# Patient Record
Sex: Male | Born: 1950 | Race: White | Hispanic: No | Marital: Married | Smoking: Former smoker
Health system: Southern US, Community
[De-identification: ages and names within clinical notes are randomized; demographics above are authoritative.]

## PROBLEM LIST (undated history)

## (undated) DIAGNOSIS — K921 Melena: Secondary | ICD-10-CM

## (undated) DIAGNOSIS — Z9889 Other specified postprocedural states: Secondary | ICD-10-CM

## (undated) DIAGNOSIS — Z860100 Personal history of colon polyps, unspecified: Secondary | ICD-10-CM

## (undated) DIAGNOSIS — K5190 Ulcerative colitis, unspecified, without complications: Secondary | ICD-10-CM

## (undated) HISTORY — DX: Melena: K92.1

## (undated) HISTORY — DX: Personal history of colon polyps, unspecified: Z86.0100

## (undated) HISTORY — PX: KNEE SURGERY: SHX244

## (undated) HISTORY — DX: Personal history of colonic polyps: Z86.010

## (undated) HISTORY — DX: Ulcerative colitis, unspecified, without complications: K51.90

## (undated) HISTORY — PX: BACK SURGERY: SHX140

---

## 2016-07-22 DIAGNOSIS — S39012A Strain of muscle, fascia and tendon of lower back, initial encounter: Secondary | ICD-10-CM | POA: Insufficient documentation

## 2016-07-22 DIAGNOSIS — M545 Low back pain, unspecified: Secondary | ICD-10-CM | POA: Insufficient documentation

## 2017-03-04 ENCOUNTER — Encounter: Payer: Self-pay | Admitting: *Deleted

## 2017-03-29 ENCOUNTER — Encounter: Payer: Self-pay | Admitting: Family Medicine

## 2017-03-29 ENCOUNTER — Ambulatory Visit (INDEPENDENT_AMBULATORY_CARE_PROVIDER_SITE_OTHER): Payer: BLUE CROSS/BLUE SHIELD | Admitting: Family Medicine

## 2017-03-29 VITALS — BP 108/62 | HR 60 | Temp 98.3°F | Ht 66.0 in | Wt 169.8 lb

## 2017-03-29 DIAGNOSIS — K5150 Left sided colitis without complications: Secondary | ICD-10-CM | POA: Insufficient documentation

## 2017-03-29 DIAGNOSIS — K5190 Ulcerative colitis, unspecified, without complications: Secondary | ICD-10-CM | POA: Diagnosis not present

## 2017-03-29 DIAGNOSIS — F5101 Primary insomnia: Secondary | ICD-10-CM

## 2017-03-29 DIAGNOSIS — Z7689 Persons encountering health services in other specified circumstances: Secondary | ICD-10-CM | POA: Diagnosis not present

## 2017-03-29 DIAGNOSIS — R39198 Other difficulties with micturition: Secondary | ICD-10-CM

## 2017-03-29 DIAGNOSIS — N529 Male erectile dysfunction, unspecified: Secondary | ICD-10-CM

## 2017-03-29 LAB — POC URINALSYSI DIPSTICK (AUTOMATED)
BILIRUBIN UA: NEGATIVE
Blood, UA: NEGATIVE
GLUCOSE UA: NEGATIVE
Ketones, UA: NEGATIVE
LEUKOCYTES UA: NEGATIVE
NITRITE UA: NEGATIVE
PH UA: 6 (ref 5.0–8.0)
Protein, UA: NEGATIVE
Spec Grav, UA: >=1.03 — AB (ref 1.010–1.025)
Urobilinogen, UA: 0.2 E.U./dL

## 2017-03-29 LAB — CBC WITH DIFFERENTIAL/PLATELET
BASOS ABS: 0.1 10*3/uL (ref 0.0–0.1)
Basophils Relative: 1.2 % (ref 0.0–3.0)
EOS ABS: 0.7 10*3/uL (ref 0.0–0.7)
Eosinophils Relative: 11.2 % — ABNORMAL HIGH (ref 0.0–5.0)
HEMATOCRIT: 40.4 % (ref 39.0–52.0)
HEMOGLOBIN: 13.4 g/dL (ref 13.0–17.0)
LYMPHS PCT: 32.3 % (ref 12.0–46.0)
Lymphs Abs: 2.2 10*3/uL (ref 0.7–4.0)
MCHC: 33 g/dL (ref 30.0–36.0)
MCV: 81.1 fl (ref 78.0–100.0)
Monocytes Absolute: 0.7 10*3/uL (ref 0.1–1.0)
Monocytes Relative: 10.2 % (ref 3.0–12.0)
Neutro Abs: 3 10*3/uL (ref 1.4–7.7)
Neutrophils Relative %: 45.1 % (ref 43.0–77.0)
Platelets: 278 10*3/uL (ref 150.0–400.0)
RBC: 4.98 Mil/uL (ref 4.22–5.81)
RDW: 18.7 % — AB (ref 11.5–15.5)
WBC: 6.7 10*3/uL (ref 4.0–10.5)

## 2017-03-29 LAB — PSA: PSA: 0.41 ng/mL (ref 0.10–4.00)

## 2017-03-29 LAB — TSH: TSH: 2.02 u[IU]/mL (ref 0.35–4.50)

## 2017-03-29 NOTE — Progress Notes (Signed)
Subjective:    Patient ID: Gregory Obrien, male    DOB: 23-Oct-1950, 67 y.o.   MRN: [id number]  HPI This is a 67 yo male who presents today to establish care. 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 Enjoys basketball, his dogs.   Last CPE- many years ago PSA- never, requests today Colonoscopy- every year due to UC Tdap- unsure Flu- annual Dental- regular Eye- regular Exercise- walks his dog for short walks several times a day and works out at gym 1x/week   UC- Has a history of ulcerative colitis. Remission x 2.5 years. Sees Dr. Beatrix Fetters out of [place].   GU- Some weak urine stream x about 1 year. He requests DRE and psa today.  Good libido, occasional difficulty obtaining an erection, no difficulty with climax. No dysuria, no frequency, no discharge.   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 Rarely requires any medication for pain. Thinks back pain may interfere with sexual performance.   Insomnia- goes to bed around 9-10, awakens around 3-4 and can't go back to sleep. Gets up around 5 am. Every night sleep is disrupted. Around 4-5 hours sleep a night. Is able to function for work, but has daytime sleepiness. Occasionally naps. Takes diphenhydramine occasionally, melatonin slight improvement. Stress level not high. Works out 1x week and walks some daily.  AM coffee, no alcohol.   Denies- headache, chest pain, SOB, nausea, vomiting, diarrhea, constipation, abdominal pain, leg swelling.   Past Medical History:  Diagnosis Date  . Blood in stool   . History of colon polyps    Past Surgical History:  Procedure Laterality Date  . BACK SURGERY    . KNEE SURGERY     Family History  Problem Relation Age of Onset  . Heart disease Father   . Heart attack Father   . Drug abuse Brother   . Early death Brother    Social History   Tobacco Use  . Smoking status: Former  Games developer  . Smokeless tobacco: Never Used  Substance Use Topics  . Alcohol use: Yes  . Drug use: Never       Review of Systems Per HPI    Objective:   Physical Exam  Constitutional: He is oriented to person, place, and time. He appears well-developed and well-nourished. No distress.  HENT:  Head: Normocephalic and atraumatic.  Eyes: Conjunctivae are normal.  Cardiovascular: Normal rate, regular rhythm and normal heart sounds.  Pulmonary/Chest: Effort normal and breath sounds normal.  Genitourinary: Rectal exam shows no external hemorrhoid, no mass and no tenderness.  Genitourinary Comments: Difficult to palpate prostate. No masses. Non tender.   Musculoskeletal: He exhibits no edema.  Neurological: He is alert and oriented to person, place, and time.  Skin: Skin is warm and dry. He is not diaphoretic.  Psychiatric: He has a normal mood and affect. His behavior is normal. Judgment and thought content normal.  Vitals reviewed.    BP 108/62   Pulse 60   Temp 98.3 F (36.8 C) (Oral)   Ht 5\' 6"  (1.676 m)   Wt 169 lb 12 oz (77 kg)   SpO2 96%   BMI 27.40 kg/m       Assessment & Plan:  1. Encounter to establish care - Discussed and encouraged healthy lifestyle choices- adequate sleep, regular exercise, stress management and healthy food choices.  - follow up for CPE 6  months - will obtain records from PCP  2. Decreased urine stream - PSA - POCT Urinalysis Dipstick (Automated)  3. Erectile dysfunction, unspecified erectile dysfunction type - TSH - CBC with Differential - Testosterone, future- will check early morning with CMET, lipids  4. Ulcerative colitis without complications, unspecified location [facility](HCC) - currently in remission, will obtain records from GI  5. Primary insomnia - Discussed sleep hygiene, recommended he increase exercise, avoid electronics an hour before bedtime    Olean Reeeborah Gessner, FNP-BC  [facility], [facility]  03/30/2017 8:09 AM

## 2017-03-29 NOTE — Patient Instructions (Signed)
Good to meet you today  Please schedule your complete physical for September  I will notify you of lab results  Insomnia Insomnia is a sleep disorder that makes it difficult to fall asleep or to stay asleep. Insomnia can cause tiredness (fatigue), low energy, difficulty concentrating, mood swings, and poor performance at work or school. There are three different ways to classify insomnia:  Difficulty falling asleep.  Difficulty staying asleep.  Waking up too early in the morning.  Any type of insomnia can be long-term (chronic) or short-term (acute). Both are common. Short-term insomnia usually lasts for three months or less. Chronic insomnia occurs at least three times a week for longer than three months. What are the causes? Insomnia may be caused by another condition, situation, or substance, such as:  Anxiety.  Certain medicines.  Gastroesophageal reflux disease (GERD) or other gastrointestinal conditions.  Asthma or other breathing conditions.  Restless legs syndrome, sleep apnea, or other sleep disorders.  Chronic pain.  Menopause. This may include hot flashes.  Stroke.  Abuse of alcohol, tobacco, or illegal drugs.  Depression.  Caffeine.  Neurological disorders, such as Alzheimer disease.  An overactive thyroid (hyperthyroidism).  The cause of insomnia may not be known. What increases the risk? Risk factors for insomnia include:  Gender. Women are more commonly affected than men.  Age. Insomnia is more common as you get older.  Stress. This may involve your professional or personal life.  Income. Insomnia is more common in people with lower income.  Lack of exercise.  Irregular work schedule or night shifts.  Traveling between different time zones.  What are the signs or symptoms? If you have insomnia, trouble falling asleep or trouble staying asleep is the main symptom. This may lead to other symptoms, such as:  Feeling fatigued.  Feeling  nervous about going to sleep.  Not feeling rested in the morning.  Having trouble concentrating.  Feeling irritable, anxious, or depressed.  How is this treated? Treatment for insomnia depends on the cause. If your insomnia is caused by an underlying condition, treatment will focus on addressing the condition. Treatment may also include:  Medicines to help you sleep.  Counseling or therapy.  Lifestyle adjustments.  Follow these instructions at home:  Take medicines only as directed by your health care provider.  Keep regular sleeping and waking hours. Avoid naps.  Keep a sleep diary to help you and your health care provider figure out what could be causing your insomnia. Include: ? When you sleep. ? When you wake up during the night. ? How well you sleep. ? How rested you feel the next day. ? Any side effects of medicines you are taking. ? What you eat and drink.  Make your bedroom a comfortable place where it is easy to fall asleep: ? Put up shades or special blackout curtains to block light from outside. ? Use a white noise machine to block noise. ? Keep the temperature cool.  Exercise regularly as directed by your health care provider. Avoid exercising right before bedtime.  Use relaxation techniques to manage stress. Ask your health care provider to suggest some techniques that may work well for you. These may include: ? Breathing exercises. ? Routines to release muscle tension. ? Visualizing peaceful scenes.  Cut back on alcohol, caffeinated beverages, and cigarettes, especially close to bedtime. These can disrupt your sleep.  Do not overeat or eat spicy foods right before bedtime. This can lead to digestive discomfort that can make it  hard for you to sleep.  Limit screen use before bedtime. This includes: ? Watching TV. ? Using your smartphone, tablet, and computer.  Stick to a routine. This can help you fall asleep faster. Try to do a quiet activity, brush  your teeth, and go to bed at the same time each night.  Get out of bed if you are still awake after 15 minutes of trying to sleep. Keep the lights down, but try reading or doing a quiet activity. When you feel sleepy, go back to bed.  Make sure that you drive carefully. Avoid driving if you feel very sleepy.  Keep all follow-up appointments as directed by your health care provider. This is important. Contact a health care provider if:  You are tired throughout the day or have trouble in your daily routine due to sleepiness.  You continue to have sleep problems or your sleep problems get worse. Get help right away if:  You have serious thoughts about hurting yourself or someone else. This information is not intended to replace advice given to you by your health care provider. Make sure you discuss any questions you have with your health care provider. Document Released: 12/20/1999 Document Revised: 05/24/2015 Document Reviewed: 09/22/2013 Elsevier Interactive Patient Education  [employer].

## 2017-03-31 NOTE — Addendum Note (Signed)
Addended by: Olean ReeGESSNER, DEBORAH B on: 03/31/2017 06:09 PM   Modules accepted: Orders

## 2017-04-05 ENCOUNTER — Other Ambulatory Visit (INDEPENDENT_AMBULATORY_CARE_PROVIDER_SITE_OTHER): Payer: BLUE CROSS/BLUE SHIELD

## 2017-04-05 DIAGNOSIS — N529 Male erectile dysfunction, unspecified: Secondary | ICD-10-CM | POA: Diagnosis not present

## 2017-04-05 DIAGNOSIS — R7989 Other specified abnormal findings of blood chemistry: Secondary | ICD-10-CM

## 2017-04-05 LAB — LIPID PANEL
Cholesterol: 251 mg/dL — ABNORMAL HIGH (ref 0–200)
HDL: 43.6 mg/dL (ref >39.00)
LDL CALC: 186 mg/dL — AB (ref 0–99)
NonHDL: 206.97
TRIGLYCERIDES: 104 mg/dL (ref 0.0–149.0)
Total CHOL/HDL Ratio: 6
VLDL: 20.8 mg/dL (ref 0.0–40.0)

## 2017-04-05 LAB — COMPREHENSIVE METABOLIC PANEL
ALBUMIN: 4.1 g/dL (ref 3.5–5.2)
ALK PHOS: 45 U/L (ref 39–117)
ALT: 11 U/L (ref 0–53)
AST: 15 U/L (ref 0–37)
BUN: 21 mg/dL (ref 6–23)
CO2: 27 mEq/L (ref 19–32)
Calcium: 9.5 mg/dL (ref 8.4–10.5)
Chloride: 104 mEq/L (ref 96–112)
Creatinine, Ser: 1.15 mg/dL (ref 0.40–1.50)
GFR: 67.75 mL/min (ref >60.00)
Glucose, Bld: 120 mg/dL — ABNORMAL HIGH (ref 70–99)
POTASSIUM: 4 meq/L (ref 3.5–5.1)
Sodium: 140 mEq/L (ref 135–145)
Total Bilirubin: 0.4 mg/dL (ref 0.2–1.2)
Total Protein: 7.5 g/dL (ref 6.0–8.3)

## 2017-04-05 LAB — TESTOSTERONE: Testosterone: 154.76 ng/dL — ABNORMAL LOW (ref 300.00–890.00)

## 2017-04-06 NOTE — Addendum Note (Signed)
Addended by: Olean ReeGESSNER, Lamekia Nolden B on: 04/06/2017 11:26 AM   Modules accepted: Orders

## 2017-04-07 ENCOUNTER — Other Ambulatory Visit: Payer: Self-pay | Admitting: Family Medicine

## 2017-04-07 DIAGNOSIS — E782 Mixed hyperlipidemia: Secondary | ICD-10-CM

## 2017-04-07 MED ORDER — PRAVASTATIN SODIUM 20 MG PO TABS: 20.0000 mg | ORAL_TABLET | Freq: Every day | ORAL | 3 refills | Status: DC

## 2017-05-05 ENCOUNTER — Ambulatory Visit (INDEPENDENT_AMBULATORY_CARE_PROVIDER_SITE_OTHER): Payer: BLUE CROSS/BLUE SHIELD | Admitting: Urology

## 2017-05-05 ENCOUNTER — Encounter: Payer: Self-pay | Admitting: Urology

## 2017-05-05 VITALS — BP 121/82 | HR 83 | Resp 16 | Ht 66.0 in | Wt 171.8 lb

## 2017-05-05 DIAGNOSIS — E291 Testicular hypofunction: Secondary | ICD-10-CM

## 2017-05-05 DIAGNOSIS — N5201 Erectile dysfunction due to arterial insufficiency: Secondary | ICD-10-CM | POA: Diagnosis not present

## 2017-05-05 MED ORDER — SILDENAFIL CITRATE 20 MG PO TABS: ORAL_TABLET | ORAL | 0 refills | Status: DC

## 2017-05-05 NOTE — Progress Notes (Signed)
05/05/2017 6:22 PM   Gregory Obrien [birth year] [id number]  Referring provider: Emi Belfast, FNP [address] [place], [place] [address]  Chief Complaint  Patient presents with  . Erectile Dysfunction    HPI: 67 year old male seen in consultation at the request of Earl Many, FNP for evaluation of hypogonadism and erectile dysfunction.  He was seen on 03/29/2017 for a routine physical and had complaints of a weak urinary stream for approximately 1 year and difficulty achieving and maintaining an erection.  He states his stream is slightly decreased but not bothersome.  He was concerned of this symptom.  He did have a PSA drawn which was low at 0.41.  He has mild intermittent urinary stream and urinary hesitancy.  He has no frequency or urgency.  He also notes occasional difficulty achieving and maintaining an erection.  He is able to achieve penetration and have successful intercourse approximately 65% of the time.  He denies pain with erections or curvature with erections.  His libido is good although he does relate to some tiredness and fatigue.  He was recently diagnosed with hyper cholesterol and started on pravastatin.  He smoked for 15 years and quit approximately 15 years ago.  A testosterone level was low at 155.  He denies previous history of urologic problems or prior urologic evaluation.   PMH: Past Medical History:  Diagnosis Date  . Blood in stool   . History of colon polyps   . UC (ulcerative colitis) (HCC)   . Ulcerative colitis (HCC)    remission since 2017, see Dr. Beatrix Fetters    Surgical History: Past Surgical History:  Procedure Laterality Date  . BACK SURGERY    . KNEE SURGERY      Home Medications:  Allergies as of 05/05/2017      Reactions   Infliximab Hives   (REMICADE)      Medication List        Accurate as of 05/05/17  6:22 PM. Always use your most recent med list.          pravastatin 20 MG tablet Commonly known as:   PRAVACHOL Take 1 tablet (20 mg total) by mouth daily.   sildenafil 20 MG tablet Commonly known as:  REVATIO 2-5 tabs 1 hour prior to intercourse       Allergies:  Allergies  Allergen Reactions  . Infliximab Hives    (REMICADE)    Family History: Family History  Problem Relation Age of Onset  . Heart disease Father   . Heart attack Father   . Drug abuse Brother   . Early death Brother     Social History:  reports that he has quit smoking. He has never used smokeless tobacco. He reports that he drinks alcohol. He reports that he does not use drugs.  ROS: UROLOGY Frequent Urination?: No Hard to postpone urination?: No Burning/pain with urination?: No Get up at night to urinate?: Yes Leakage of urine?: No Urine stream starts and stops?: Yes Do you have to strain to urinate?: Yes Blood in urine?: No Urinary tract infection?: No Sexually transmitted disease?: No Injury to kidneys or bladder?: No Painful intercourse?: No Weak stream?: Yes Erection problems?: Yes Penile pain?: No  Gastrointestinal Nausea?: No Vomiting?: No Indigestion/heartburn?: No Diarrhea?: No Constipation?: No  Constitutional Fever: No Night sweats?: No Weight loss?: No Fatigue?: No  Skin Skin rash/lesions?: No Itching?: No  Eyes Blurred vision?: No Double vision?: No  Ears/Nose/Throat Sore throat?: No Sinus problems?: No  Hematologic/Lymphatic Swollen glands?: No Easy bruising?: No  Cardiovascular Leg swelling?: No Chest pain?: No  Respiratory Cough?: No Shortness of breath?: No  Endocrine Excessive thirst?: No  Musculoskeletal Back pain?: Yes Joint pain?: No  Neurological Headaches?: No Dizziness?: No  Psychologic Depression?: No Anxiety?: No  Physical Exam: BP 121/82   Pulse 83   Resp 16   Ht  (1.676 m)   Wt 171 lb 12.8 oz (77.9 kg)   SpO2 98%   BMI 27.73 kg/m   Constitutional:  Alert and oriented, No acute distress. HEENT: [place] AT, moist  mucus membranes.  Trachea midline, no masses. Cardiovascular: No clubbing, cyanosis, or edema. Respiratory: Normal respiratory effort, no increased work of breathing. GI: Abdomen is soft, nontender, nondistended, no abdominal masses GU: No CVA tenderness.  Penis without lesions or corporal plaques.  Testes descended bilateral without masses or tenderness.  Prostate 30 g, smooth without nodules. Lymph: No cervical or inguinal lymphadenopathy. Skin: No rashes, bruises or suspicious lesions. Neurologic: Grossly intact, no focal deficits, moving all 4 extremities. Psychiatric: Normal mood and affect.  Laboratory Data: Lab Results  Component Value Date   WBC 6.7 03/29/2017   HGB 13.4 03/29/2017   HCT 40.4 03/29/2017   MCV 81.1 03/29/2017   PLT 278.0 03/29/2017    Lab Results  Component Value Date   CREATININE 1.15 04/05/2017    Lab Results  Component Value Date   PSA 0.41 03/29/2017    Lab Results  Component Value Date   TESTOSTERONE 154.76 (L) 04/05/2017     Assessment & Plan:   67 year old male with mild obstructive voiding symptoms, erectile dysfunction and hypogonadism.  DRE is benign and PSA is low.  I discussed medical management of his symptoms which are most likely due to outlet obstruction however he states they are not bothersome that he desires medication and he would like to monitor.  He has good libido and only mild ED.  His tiredness may not be related to his hypogonadism.  Testosterone replacement therapy and side effects were discussed.  He wanted to hold off on TRT but was interested in a trial of PDE 5 inhibitor.  An Rx for generic sildenafil was sent to his pharmacy.  Return if symptoms worsen or fail to improve.    Riki Altes, MD  [facility] [address] [place], [place] [address] [phone]

## 2017-05-13 ENCOUNTER — Encounter: Payer: Self-pay | Admitting: Urology

## 2017-05-19 ENCOUNTER — Ambulatory Visit: Payer: BLUE CROSS/BLUE SHIELD | Admitting: Family Medicine

## 2017-05-19 ENCOUNTER — Encounter: Payer: Self-pay | Admitting: Family Medicine

## 2017-05-19 VITALS — BP 138/80 | HR 74 | Temp 98.3°F | Ht 66.0 in | Wt 175.5 lb

## 2017-05-19 DIAGNOSIS — B9789 Other viral agents as the cause of diseases classified elsewhere: Secondary | ICD-10-CM

## 2017-05-19 DIAGNOSIS — J069 Acute upper respiratory infection, unspecified: Secondary | ICD-10-CM

## 2017-05-19 MED ORDER — BENZONATATE 100 MG PO CAPS: 100.0000 mg | ORAL_CAPSULE | Freq: Three times a day (TID) | ORAL | 0 refills | Status: DC | PRN

## 2017-05-19 MED ORDER — AMOXICILLIN 875 MG PO TABS: 875.0000 mg | ORAL_TABLET | Freq: Two times a day (BID) | ORAL | 0 refills | Status: DC

## 2017-05-19 MED ORDER — GUAIFENESIN-CODEINE 100-10 MG/5ML PO SYRP: 10.0000 mL | ORAL_SOLUTION | Freq: Three times a day (TID) | ORAL | 0 refills | Status: DC | PRN

## 2017-05-19 NOTE — Progress Notes (Signed)
Subjective:    Patient ID: Gregory Obrien, male    DOB: 1950-05-21, 67 y.o.   MRN: [id number]  HPI This is a 67 yo male who presents today with cough x 5 days. Has been hearing wheezing, little sputum, some SOB, subjective fever, exhausted. Runny nose started yesterday, clear. Some sore throat, no ear pain. Drinking herbal tea, using Vics vapor rub, alka selter cough. Ibuprofen for back pain 400 mg every 8 hours. Cough at night.  Sick contacts at work.   Past Medical History:  Diagnosis Date  . Blood in stool   . History of colon polyps   . UC (ulcerative colitis) (HCC)   . Ulcerative colitis (HCC)    remission since 2017, see Dr. Beatrix Fetters   Past Surgical History:  Procedure Laterality Date  . BACK SURGERY    . KNEE SURGERY     Family History  Problem Relation Age of Onset  . Heart disease Father   . Heart attack Father   . Drug abuse Brother   . Early death Brother    Social History   Tobacco Use  . Smoking status: Former Games developer  . Smokeless tobacco: Never Used  Substance Use Topics  . Alcohol use: Yes  . Drug use: Never      Review of Systems    per HPI Objective:   Physical Exam  Constitutional: He is oriented to person, place, and time. He appears well-developed and well-nourished. No distress.  HENT:  Head: Normocephalic and atraumatic.  Right Ear: Tympanic membrane, external ear and ear canal normal.  Left Ear: Tympanic membrane, external ear and ear canal normal.  Nose: Rhinorrhea present.  Mouth/Throat: Uvula is midline and mucous membranes are normal. Posterior oropharyngeal erythema present. No oropharyngeal exudate, posterior oropharyngeal edema or tonsillar abscesses.  Eyes: Conjunctivae are normal.  Neck: Normal range of motion. Neck supple.  Cardiovascular: Normal rate, regular rhythm and normal heart sounds.  Pulmonary/Chest: Effort normal and breath sounds normal.  Lymphadenopathy:    He has no cervical adenopathy.  Neurological: He is  alert and oriented to person, place, and time.  Skin: Skin is warm and dry. He is not diaphoretic.  Psychiatric: He has a normal mood and affect. His behavior is normal. Judgment and thought content normal.  Vitals reviewed.     BP 138/80 (BP Location: Right Arm, Patient Position: Sitting, Cuff Size: Normal)   Pulse 74   Temp 98.3 F (36.8 C) (Oral)   Ht  (1.676 m)   Wt 175 lb 8 oz (79.6 kg)   SpO2 96%   BMI 28.33 kg/m  Wt Readings from Last 3 Encounters:  05/19/17 175 lb 8 oz (79.6 kg)  05/05/17 171 lb 12.8 oz (77.9 kg)  03/29/17 169 lb 12 oz (77 kg)       Assessment & Plan:  1. Viral URI with cough -Provided written and verbal information regarding diagnosis and treatment. - Provided with wait and see antibiotic prescription and instructions for use - RTC precautions reviewed - add Mucinex, push fluids, rest, oow note for today and tomorrow - amoxicillin (AMOXIL) 875 MG tablet; Take 1 tablet (875 mg total) by mouth 2 (two) times daily.  Dispense: 14 tablet; Refill: 0 - benzonatate (TESSALON) 100 MG capsule; Take 1-2 capsules (100-200 mg total) by mouth 3 (three) times daily as needed for cough.  Dispense: 40 capsule; Refill: 0 - guaiFENesin-codeine (ROBITUSSIN AC) 100-10 MG/5ML syrup; Take 10 mLs by mouth 3 (three) times daily as needed  for cough.  Dispense: 120 mL; Refill: 0   Olean Ree, FNP-BC  [place] [facility]  05/19/2017 10:25 AM

## 2017-05-19 NOTE — Patient Instructions (Signed)
Good to see you today  I have sent in two medications for cough to your pharmacy- a pill for daytime and a liquid that may make you sleepy- don't take if you will be driving  Please add mucinex to thin your sputum. Generic is fine. Drink enough fluids to make your urine light yellow.   If not better in 5-7 days or if better then worse, please start antibiotic

## 2017-06-04 ENCOUNTER — Other Ambulatory Visit: Payer: Self-pay

## 2017-06-04 MED ORDER — SILDENAFIL CITRATE 20 MG PO TABS: ORAL_TABLET | ORAL | 10 refills | Status: DC

## 2017-06-08 ENCOUNTER — Telehealth: Payer: Self-pay | Admitting: Urology

## 2017-06-08 MED ORDER — SILDENAFIL CITRATE 20 MG PO TABS: ORAL_TABLET | ORAL | 2 refills | Status: DC

## 2017-06-08 NOTE — Telephone Encounter (Signed)
RX printed, patient notified to pick up RX.

## 2017-06-08 NOTE — Telephone Encounter (Signed)
Pt called office, was told at visit to try medication and if he tolerated well to call office back to get Rx sent in. Pt asks for Rx to be sent to [facility] [address] Please advise pt. Thanks.

## 2017-06-09 ENCOUNTER — Other Ambulatory Visit: Payer: Self-pay | Admitting: Urology

## 2017-06-09 ENCOUNTER — Telehealth: Payer: Self-pay

## 2017-06-09 DIAGNOSIS — N5201 Erectile dysfunction due to arterial insufficiency: Secondary | ICD-10-CM

## 2017-06-09 MED ORDER — SILDENAFIL CITRATE 20 MG PO TABS: ORAL_TABLET | ORAL | 1 refills | Status: DC

## 2017-06-09 NOTE — Telephone Encounter (Signed)
Incoming call from pt stating that [facility] [place], this is no longer the patient's current pharmacy rx needs [facility]. [facility] [place], had rx cancelled. RX sent to correct pharmacy.

## 2017-07-05 ENCOUNTER — Ambulatory Visit: Payer: Self-pay | Admitting: *Deleted

## 2017-07-05 NOTE — Telephone Encounter (Signed)
Patient states he had 2 dizziness episodes. He had to lay down yesterday- 15-20 minutes. Patient has been taking new medication for  2 months.Patient has not been monitoring BP at home- but he can monitor it at work. He is working out of town this week and he can not come in until late. Appointment given- patient to monitor his BP for fluctuations and if his symptoms come back- he is to be sem immediately.  Reason for Disposition . [1] MODERATE dizziness (e.g., interferes with normal activities) AND [2] has NOT been evaluated by physician for this  (Exception: dizziness caused by heat exposure, sudden standing, or poor fluid intake)  Answer Assessment - Initial Assessment Questions 1. DESCRIPTION: "Describe your dizziness."     Nauseous and felt like a weight was on him 2. LIGHTHEADED: "Do you feel lightheaded?" (e.g., somewhat faint, woozy, weak upon standing)     Spinning 3. VERTIGO: "Do you feel like either you or the room is spinning or tilting?" (i.e. vertigo)     Spinning and tilting 4. SEVERITY: "How bad is it?"  "Do you feel like you are going to faint?" "Can you stand and walk?"   - MILD - walking normally   - MODERATE - interferes with normal activities (e.g., work, school)    - SEVERE - unable to stand, requires support to walk, feels like passing out now.      Happened twice- severe yesterday 5. ONSET:  "When did the dizziness begin?"     2 weeks ago episode and yesterday 6. AGGRAVATING FACTORS: "Does anything make it worse?" (e.g., standing, change in head position)     Standing and walking- after 20 minutes began to pass 7. HEART RATE: "Can you tell me your heart rate?" "How many beats in 15 seconds?"  (Note: not all patients can do this)       Could not tell difference 8. CAUSE: "What do you think is causing the dizziness?"     Patient is on new medication 9. RECURRENT SYMPTOM: "Have you had dizziness before?" If so, ask: "When was the last time?" "What happened that time?"    Never happened before 10. OTHER SYMPTOMS: "Do you have any other symptoms?" (e.g., fever, chest pain, vomiting, diarrhea, bleeding)       vomiting with first event 11. PREGNANCY: "Is there any chance you are pregnant?" "When was your last menstrual period?"       n/a  Protocols used: DIZZINESS [facility]- LIGHTHEADEDNESS-A-AH

## 2017-07-05 NOTE — Telephone Encounter (Signed)
Erskine SquibbJane also skyped this note;  [07/05/2017 3:43 PM]  Elby Beckaul, Jane:   rena- i am on the phone with Donnamarie Rossettiaul Caddell [phone]  he has had 2 dizzy spells- 1- 2 weeks ago and 1 yesterday- he needs an appointment but works out of town and can only come 4-4:30. Can he be scheduled this week?   [07/05/2017 3:50 PM]  Elby Beckaul, Jane:   Do you think I could have one of those 6:00 appointments on Wednesday?  Before I could answer Erskine SquibbJane back because I was on the phone with another pt; Erskine SquibbJane already scheduled appt with Dr Milinda Antisower on 07/07/17 at 6 pm. FYI to DR SunTrustower.

## 2017-07-07 ENCOUNTER — Ambulatory Visit: Payer: BLUE CROSS/BLUE SHIELD | Admitting: Family Medicine

## 2017-07-07 ENCOUNTER — Encounter: Payer: Self-pay | Admitting: Family Medicine

## 2017-07-07 VITALS — BP 130/76 | HR 77 | Temp 98.2°F | Ht 66.0 in | Wt 172.8 lb

## 2017-07-07 DIAGNOSIS — R42 Dizziness and giddiness: Secondary | ICD-10-CM | POA: Insufficient documentation

## 2017-07-07 MED ORDER — MECLIZINE HCL 25 MG PO TABS: 25.0000 mg | ORAL_TABLET | Freq: Three times a day (TID) | ORAL | 0 refills | Status: DC | PRN

## 2017-07-07 NOTE — Progress Notes (Signed)
Subjective:    Patient ID: Gregory RossettiPaul Obrien, male    DOB: 08/13/1950, 67 y.o.   MRN: [id number]  HPI  67 yo pt of NP Leone PayorGessner here with new onset dizziness with nausea  He has hx of UC   2 weeks ago had a bad dizzy spell [place] a [place] in [place]  (severe spinning)  He was sitting- when he stood became very dizzy - went to the bathroom and vomited Sweated a lot- laid down and slept for 1 1/2 hours- felt mostly better   Again this Saturday - at the movies - came out of the movie  Hit suddenly and he had to sit down on the sidewalk - took 15 minutes to stand   Today- mildly light headed Still going to work  [employer]els like if he moves too quickly it will come on   Ok rolling over in bed  No particular position is worse than another   No ear or congestion problems lately  No fever No tick bites  Had a cough in may -got over that   Wt Readings from Last 3 Encounters:  07/07/17 172 lb 12 oz (78.4 kg)  05/19/17 175 lb 8 oz (79.6 kg)  05/05/17 171 lb 12.8 oz (77.9 kg)   27.88 kg/m    BP Readings from Last 3 Encounters:  07/07/17 130/76  05/19/17 138/80  05/05/17 121/82   Pulse Readings from Last 3 Encounters:  07/07/17 77  05/19/17 74  05/05/17 83    Patient Active Problem List   Diagnosis Date Noted  . Vertigo 07/07/2017  . Ulcerative colitis (HCC)    Past Medical History:  Diagnosis Date  . Blood in stool   . History of colon polyps   . UC (ulcerative colitis) (HCC)   . Ulcerative colitis (HCC)    remission since 2017, see Dr. Beatrix FettersNanahari   Past Surgical History:  Procedure Laterality Date  . BACK SURGERY    . KNEE SURGERY     Social History   Tobacco Use  . Smoking status: Former Games developermoker  . Smokeless tobacco: Never Used  Substance Use Topics  . Alcohol use: Yes  . Drug use: Never   Family History  Problem Relation Age of Onset  . Heart disease Father   . Heart attack Father   . Drug abuse Brother   . Early death Brother    Allergies  Allergen  Reactions  . Infliximab Hives    (REMICADE)   Current Outpatient Medications on File Prior to Visit  Medication Sig Dispense Refill  . pravastatin (PRAVACHOL) 20 MG tablet Take 1 tablet (20 mg total) by mouth daily. 90 tablet 3  . sildenafil (REVATIO) 20 MG tablet 2-5 tabs 1 hour prior to intercourse (Patient not taking: Reported on 07/07/2017) 10 tablet 1   No current facility-administered medications on file prior to visit.     Review of Systems  Constitutional: Negative for activity change, appetite change, fatigue, fever and unexpected weight change.  HENT: Negative for congestion, rhinorrhea, sore throat and trouble swallowing.   Eyes: Negative for pain, redness, itching and visual disturbance.  Respiratory: Negative for cough, chest tightness, shortness of breath and wheezing.   Cardiovascular: Negative for chest pain and palpitations.  Gastrointestinal: Negative for abdominal pain, blood in stool, constipation, diarrhea and nausea.  Endocrine: Negative for cold intolerance, heat intolerance, polydipsia and polyuria.  Genitourinary: Negative for difficulty urinating, dysuria, frequency and urgency.  Musculoskeletal: Negative for arthralgias, joint swelling and myalgias.  Skin: Negative for pallor and rash.  Neurological: Positive for dizziness. Negative for tremors, seizures, syncope, facial asymmetry, speech difficulty, weakness, light-headedness, numbness and headaches.  Hematological: Negative for adenopathy. Does not bruise/bleed easily.  Psychiatric/Behavioral: Negative for decreased concentration and dysphoric mood. The patient is not nervous/anxious.        Objective:   Physical Exam  Constitutional: He appears well-developed and well-nourished. No distress.  Well appearing   HENT:  Head: Normocephalic and atraumatic.  Right Ear: External ear normal.  Left Ear: External ear normal.  Nose: Nose normal.  Mouth/Throat: Oropharynx is clear and moist.  Nl TMs bilat Scant  cerumen  No sinus tenderness/nares clear  Eyes: Pupils are equal, round, and reactive to light. Conjunctivae and EOM are normal.  2-3 beats of horizontal nystagmus bilaterally    Neck: Normal range of motion. Neck supple. No JVD present. Carotid bruit is not present. No thyromegaly present.  Cardiovascular: Normal rate, regular rhythm, normal heart sounds and intact distal pulses. Exam reveals no gallop.  Pulmonary/Chest: Effort normal and breath sounds normal. No respiratory distress. He has no wheezes. He has no rales.  No crackles  Abdominal: Soft. Bowel sounds are normal. He exhibits no distension, no abdominal bruit and no mass. There is no tenderness.  Musculoskeletal: He exhibits no edema or deformity.  Lymphadenopathy:    He has no cervical adenopathy.  Neurological: He is alert. He has normal reflexes. He displays no atrophy, no tremor and normal reflexes. No cranial nerve deficit or sensory deficit. He exhibits normal muscle tone. He displays no seizure activity. Coordination and gait normal.  No focal cerebellar signs   Not notably dizzy today  Skin: Skin is warm and dry. No rash noted.  Psychiatric: He has a normal mood and affect. His behavior is normal. Thought content normal.  Pleasant           Assessment & Plan:   Problem List Items Addressed This Visit      Other   Vertigo - Primary    Intermittent BPV per hx and exam  Feeling very slt dizzy today with nl neuro exam (episodes have been brief)  Px meclizine to take atc 2-3 d then prn with warning of sedation  Info on Epley maneuver given as well as vertigo Disc changing positions slowly  If no imp after weekend consider ENT referral  If worse or sudden change/new symptoms- ED  Rev s/s of cva as well

## 2017-07-07 NOTE — Patient Instructions (Signed)
I think you have positional vertigo  Try the meclizine - three times daily for 2 days and then take it as needed  Change position slowly (especially moving your head)   Keep hydrated- especially in the heat   Update if not starting to improve in a week or if worsening

## 2017-07-09 NOTE — Assessment & Plan Note (Addendum)
Intermittent BPV per hx and exam  Feeling very slt dizzy today with nl neuro exam (episodes have been brief)  Px meclizine to take atc 2-3 d then prn with warning of sedation  Info on Epley maneuver given as well as vertigo Disc changing positions slowly  If no imp after weekend consider ENT referral  If worse or sudden change/new symptoms- ED  Rev s/s of cva as well

## 2017-09-07 ENCOUNTER — Telehealth: Payer: Self-pay | Admitting: *Deleted

## 2017-09-07 NOTE — Telephone Encounter (Signed)
Copied from CRM [id number]. Topic: Referral - Request >> Sep 07, 2017 12:22 PM Alexander Bergeron B wrote: Reason for CRM: pt wants a referral to [facility] in [place], contact pt to advise >> Sep 07, 2017 12:28 PM Desmond Dike, CMA wrote: Attempted to contact pt to confirm reason for referral; unable to leave vm

## 2017-09-08 ENCOUNTER — Other Ambulatory Visit: Payer: Self-pay | Admitting: Family Medicine

## 2017-09-08 DIAGNOSIS — I77811 Abdominal aortic ectasia: Secondary | ICD-10-CM

## 2017-09-08 NOTE — Progress Notes (Unsigned)
aa

## 2017-09-08 NOTE — Telephone Encounter (Signed)
Called and spoke to patient he says a year ago he had a back injury. After XRAY performed they discovered a aortic anyuresum. 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 Patient also had an ultrasound at [facility] and they say he should follow up within a year with Vascular.

## 2017-09-08 NOTE — Telephone Encounter (Signed)
Please call and let him know that I have reviewed the note from Rex and have ordered the follow up test to compare to last year and will send to thoracic surgeon as indicated. He will get a call about scheduling the test.

## 2017-09-08 NOTE — Telephone Encounter (Signed)
Please call patient to determine reason for referral? May need to order testing prior to appointment per vascular practice preference.

## 2017-09-09 NOTE — Telephone Encounter (Signed)
Called and left message for pt to return call to office.  

## 2017-09-10 NOTE — Telephone Encounter (Signed)
Called and spoke to patient informing him of Debbie's note. Understanding verbalized nothing further needed at this time.

## 2017-09-15 ENCOUNTER — Other Ambulatory Visit (INDEPENDENT_AMBULATORY_CARE_PROVIDER_SITE_OTHER): Payer: Self-pay

## 2017-09-15 ENCOUNTER — Ambulatory Visit (INDEPENDENT_AMBULATORY_CARE_PROVIDER_SITE_OTHER): Payer: BLUE CROSS/BLUE SHIELD

## 2017-09-15 DIAGNOSIS — I77811 Abdominal aortic ectasia: Secondary | ICD-10-CM

## 2017-09-17 ENCOUNTER — Other Ambulatory Visit: Payer: Self-pay | Admitting: Family Medicine

## 2017-09-17 DIAGNOSIS — R739 Hyperglycemia, unspecified: Secondary | ICD-10-CM

## 2017-09-17 DIAGNOSIS — R7309 Other abnormal glucose: Secondary | ICD-10-CM

## 2017-09-17 DIAGNOSIS — E782 Mixed hyperlipidemia: Secondary | ICD-10-CM

## 2017-09-24 ENCOUNTER — Other Ambulatory Visit (INDEPENDENT_AMBULATORY_CARE_PROVIDER_SITE_OTHER): Payer: BLUE CROSS/BLUE SHIELD

## 2017-09-24 DIAGNOSIS — R7309 Other abnormal glucose: Secondary | ICD-10-CM

## 2017-09-24 DIAGNOSIS — E782 Mixed hyperlipidemia: Secondary | ICD-10-CM | POA: Diagnosis not present

## 2017-09-24 LAB — HEMOGLOBIN A1C: Hgb A1c MFr Bld: 6.1 % (ref 4.6–6.5)

## 2017-09-24 LAB — LIPID PANEL
CHOL/HDL RATIO: 6
Cholesterol: 217 mg/dL — ABNORMAL HIGH (ref 0–200)
HDL: 36.7 mg/dL — ABNORMAL LOW (ref >39.00)
LDL CALC: 144 mg/dL — AB (ref 0–99)
NONHDL: 180.66
Triglycerides: 183 mg/dL — ABNORMAL HIGH (ref 0.0–149.0)
VLDL: 36.6 mg/dL (ref 0.0–40.0)

## 2017-09-29 ENCOUNTER — Ambulatory Visit (INDEPENDENT_AMBULATORY_CARE_PROVIDER_SITE_OTHER): Payer: BLUE CROSS/BLUE SHIELD | Admitting: Family Medicine

## 2017-09-29 ENCOUNTER — Encounter: Payer: Self-pay | Admitting: Family Medicine

## 2017-09-29 VITALS — BP 138/78 | HR 65 | Temp 98.4°F | Ht 66.25 in | Wt 181.0 lb

## 2017-09-29 DIAGNOSIS — K5190 Ulcerative colitis, unspecified, without complications: Secondary | ICD-10-CM | POA: Diagnosis not present

## 2017-09-29 DIAGNOSIS — E782 Mixed hyperlipidemia: Secondary | ICD-10-CM | POA: Diagnosis not present

## 2017-09-29 DIAGNOSIS — M25552 Pain in left hip: Secondary | ICD-10-CM

## 2017-09-29 DIAGNOSIS — Z23 Encounter for immunization: Secondary | ICD-10-CM | POA: Diagnosis not present

## 2017-09-29 DIAGNOSIS — Z0000 Encounter for general adult medical examination without abnormal findings: Secondary | ICD-10-CM | POA: Diagnosis not present

## 2017-09-29 MED ORDER — PRAVASTATIN SODIUM 40 MG PO TABS: 40.0000 mg | ORAL_TABLET | Freq: Every day | ORAL | 3 refills | Status: DC

## 2017-09-29 NOTE — Progress Notes (Signed)
Subjective:    Patient ID: Gregory Obrien, male    DOB: May 29, 1950, 67 y.o.   MRN: [id number]  HPI This is a 67 yo male who presents today for CPE. Has been doing ok. 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   Last CPE- many years ago PSA- 03/09/17 Colonoscopy- 01/02/11, would like to establish with local GI for follow up of  Tdap- unsure Flu- annual Dental-regular Eye-regular Exercise- has been trying to increase, enjoys canoeing, walking dogs Diet- has been eating out more lately. Banana for breakfast, chips or snack for lunch, big dinner Sleep- falls asleep ok with melatonin, awakens in middle of night, goes to bathroom, mind racing  [place] 2 months ago and fell on left hip, pain with walking. Takes ibuprofen 400 mg once a day with some relief. Some catching sensation, some weakness.   Past Medical History:  Diagnosis Date  . Blood in stool   . History of colon polyps   . UC (ulcerative colitis) (HCC)   . Ulcerative colitis (HCC)    remission since 2017, see Dr. Beatrix Fetters   Past Surgical History:  Procedure Laterality Date  . BACK SURGERY    . KNEE SURGERY     Family History  Problem Relation Age of Onset  . Heart disease Father   . Heart attack Father   . Drug abuse Brother   . Early death Brother    Social History   Tobacco Use  . Smoking status: Former Games developer  . Smokeless tobacco: Never Used  Substance Use Topics  . Alcohol use: Yes  . Drug use: Never     Review of Systems  Constitutional: Negative.   HENT: Negative.   Eyes: Negative.   Respiratory: Positive for shortness of breath (with hard exercise only).   Cardiovascular: Negative.   Gastrointestinal: Negative.   Endocrine: Negative.   Genitourinary: Positive for difficulty urinating (slower stream, sees urology).  Musculoskeletal: Positive for back pain (chronic).       Left hip pain.   Skin: Negative.   Allergic/Immunologic: Negative.   Neurological: Negative.   Hematological:  Negative.   Psychiatric/Behavioral: Positive for sleep disturbance (difficulty staying asleep). Negative for dysphoric mood. The patient is not nervous/anxious.        Objective:   Physical Exam Physical Exam  Constitutional: He is oriented to person, place, and time. He appears well-developed and well-nourished.  HENT:  Head: Normocephalic and atraumatic.  Right Ear: External ear normal.  Left Ear: External ear normal.  Nose: Nose normal.  Mouth/Throat: Oropharynx is clear and moist.  Eyes: Conjunctivae are normal. Pupils are equal, round, and reactive to light.  Neck: Normal range of motion. Neck supple.  Cardiovascular: Normal rate, regular rhythm, normal heart sounds and intact distal pulses.   Pulmonary/Chest: Effort normal and breath sounds normal.  Abdominal: Soft. Bowel sounds are normal. Musculoskeletal: Normal range of motion. He exhibits no edema or tenderness.       Cervical back: Normal.       Thoracic back: Normal.       Lumbar back: Normal.  Right hip- normal strength, non tender, slightly decreased internal/external rotation Left hip- mild tenderness over left lateral glute, normal strength, slightly decreased internal/external rotation Lymphadenopathy:    He has no cervical adenopathy.       Right: No inguinal adenopathy present.       Left: No inguinal adenopathy present.  Neurological: He is alert and oriented to person, place, and time. He has  normal reflexes.  Skin: Skin is warm and dry.  Psychiatric: He has a normal mood and affect. His behavior is normal. Judgment normal.  Vitals reviewed.     BP 138/78 (BP Location: Right Arm, Patient Position: Sitting, Cuff Size: Normal)   Pulse 65   Temp 98.4 F (36.9 C) (Oral)   Ht 5' 6.25" (1.683 m)   Wt 181 lb (82.1 kg)   SpO2 95%   BMI 28.99 kg/m  Wt Readings from Last 3 Encounters:  09/29/17 181 lb (82.1 kg)  07/07/17 172 lb 12 oz (78.4 kg)  05/19/17 175 lb 8 oz (79.6 kg)   Depression screen [facility] 2/9  09/29/2017 03/29/2017  Decreased Interest 0 0  Down, Depressed, Hopeless 0 0  PHQ - 2 Score 0 0        Assessment & Plan:  1. Annual physical exam - Discussed and encouraged healthy lifestyle choices- adequate sleep, regular exercise, stress management and healthy food choices.    2. Mixed hyperlipidemia - will increase pravastatin to 40 mg and recheck in 3 months - Lipid Panel; Future - pravastatin (PRAVACHOL) 40 MG tablet; Take 1 tablet (40 mg total) by mouth daily.  Dispense: 90 tablet; Refill: 3  3. Ulcerative colitis without complications, unspecified location [facility]) - he would like to establish care locally, referral placed - Ambulatory referral to Gastroenterology  4. Left hip pain - no worrisome findings on exam, discussed using otc ibuprofen, heat, provided written stretching exercises for back and hip - if no improvement with above, suggested he see Dr. Patsy Lager  5. Need for influenza vaccination - Flu Vaccine QUAD 36+ mos IM   Olean Ree, FNP-BC  [facility], [facility]  09/29/2017 9:22 AM

## 2017-09-29 NOTE — Patient Instructions (Addendum)
Can use a long acting Melatonin- Remfresh  Increase your pravastatin to 40 mg a day (2 of 20 mg tablets until finished with current bottle). I have sent in a new prescription of the 40 mg.   Schedule a lab only visit in 3 months to recheck your lipid panel- be sure to fast for at least 12 hours Follow up in 6 months  Do hip and back exercises. If not better, schedule with Dr. Lorelei Pont (sports medicine)   Hip Exercises Ask your health care provider which exercises are safe for you. Do exercises exactly as told by your health care provider and adjust them as directed. It is normal to feel mild stretching, pulling, tightness, or discomfort as you do these exercises, but you should stop right away if you feel sudden pain or your pain gets worse.Do not begin these exercises until told by your health care provider. STRETCHING AND RANGE OF MOTION EXERCISES These exercises warm up your muscles and joints and improve the movement and flexibility of your hip. These exercises also help to relieve pain, numbness, and tingling. Exercise A: Hamstrings, Supine  1. Lie on your back. 2. Loop a belt or towel over the ball of your left / rightfoot. The ball of your foot is on the walking surface, right under your toes. 3. Straighten your left / rightknee and slowly pull on the belt to raise your leg. ? Do not let your left / right knee bend while you do this. ? Keep your other leg flat on the floor. ? Raise the left / right leg until you feel a gentle stretch behind your left / right knee or thigh. 4. Hold this position for __________ seconds. 5. Slowly return your leg to the starting position. Repeat __________ times. Complete this stretch __________ times a day. Exercise B: Hip Rotators  1. Lie on your back on a firm surface. 2. Hold your left / right knee with your left / right hand. Hold your ankle with your other hand. 3. Gently pull your left / right knee and rotate your lower leg toward your other  shoulder. ? Pull until you feel a stretch in your buttocks. ? Keep your hips and shoulders firmly planted while you do this stretch. 4. Hold this position for __________ seconds. Repeat __________ times. Complete this stretch __________ times a day. Exercise C: V-Sit (Hamstrings and Adductors)  1. Sit on the floor with your legs extended in a large "V" shape. Keep your knees straight during this exercise. 2. Start with your head and chest upright, then bend at your waist to reach for your left foot (position A). You should feel a stretch in your right inner thigh. 3. Hold this position for __________ seconds. Then slowly return to the upright position. 4. Bend at your waist to reach forward (position B). You should feel a stretch behind both of your thighs and knees. 5. Hold this position for __________ seconds. Then slowly return to the upright position. 6. Bend at your waist to reach for your right foot (position C). You should feel a stretch in your left inner thigh. 7. Hold this position for __________ seconds. Then slowly return to the upright position. Repeat __________ times. Complete this stretch __________ times a day. Exercise D: Lunge (Hip Flexors)  1. Place your left / right knee on the floor and bend your other knee so that is directly over your ankle. You should be half-kneeling. 2. Keep good posture with your head over your  shoulders. 3. Tighten your buttocks to point your tailbone downward. This helps your back to keep from arching too much. 4. You should feel a gentle stretch in the front of your left / right thigh and hip. If you do not feel any resistance, slightly slide your other foot forward and then slowly lunge forward so your knee once again lines up over your ankle. 5. Make sure your tailbone continues to point downward. 6. Hold this position for __________ seconds. Repeat __________ times. Complete this stretch __________ times a day. STRENGTHENING EXERCISES These  exercises build strength and endurance in your hip. Endurance is the ability to use your muscles for a long time, even after they get tired. Exercise E: Bridge (Hip Extensors)  1. Lie on your back on a firm surface with your knees bent and your feet flat on the floor. 2. Tighten your buttocks muscles and lift your bottom off the floor until the trunk of your body is level with your thighs. ? Do not arch your back. ? You should feel the muscles working in your buttocks and the back of your thighs. If you do not feel these muscles, slide your feet 1-2 inches (2.5-5 cm) farther away from your buttocks. 3. Hold this position for __________ seconds. 4. Slowly lower your hips to the starting position. 5. Let your muscles relax completely between repetitions. 6. If this exercise is too easy, try doing it with your arms crossed over your chest. Repeat __________ times. Complete this exercise __________ times a day. Exercise F: Straight Leg Raises - Hip Abductors  1. Lie on your side with your left / right leg in the top position. Lie so your head, shoulder, knee, and hip line up with each other. You may bend your bottom knee to help you balance. 2. Roll your hips slightly forward, so your hips are stacked directly over each other and your left / right knee is facing forward. 3. Leading with your heel, lift your top leg 4-6 inches (10-15 cm). You should feel the muscles in your outer hip lifting. ? Do not let your foot drift forward. ? Do not let your knee roll toward the ceiling. 4. Hold this position for __________ seconds. 5. Slowly return to the starting position. 6. Let your muscles relax completely between repetitions. Repeat __________ times. Complete this exercise __________ times a day. Exercise G: Straight Leg Raises - Hip Adductors  1. Lie on your side with your left / right leg in the bottom position. Lie so your head, shoulder, knee, and hip line up. You may place your upper foot in  front to help you balance. 2. Roll your hips slightly forward, so your hips are stacked directly over each other and your left / right knee is facing forward. 3. Tense the muscles in your inner thigh and lift your bottom leg 4-6 inches (10-15 cm). 4. Hold this position for __________ seconds. 5. Slowly return to the starting position. 6. Let your muscles relax completely between repetitions. Repeat __________ times. Complete this exercise __________ times a day. Exercise H: Straight Leg Raises - Quadriceps  1. Lie on your back with your left / right leg extended and your other knee bent. 2. Tense the muscles in the front of your left / right thigh. When you do this, you should see your kneecap slide up or see increased dimpling just above your knee. 3. Tighten these muscles even more and raise your leg 4-6 inches (10-15 cm) off the floor.  4. Hold this position for __________ seconds. 5. Keep these muscles tense as you lower your leg. 6. Relax the muscles slowly and completely between repetitions. Repeat __________ times. Complete this exercise __________ times a day. Exercise I: Hip Abductors, Standing 1. Tie one end of a rubber exercise band or tubing to a secure surface, such as a table or pole. 2. Loop the other end of the band or tubing around your left / right ankle. 3. Keeping your ankle with the band or tubing directly opposite of the secured end, step away until there is tension in the tubing or band. Hold onto a chair as needed for balance. 4. Lift your left / right leg out to your side. While you do this: ? Keep your back upright. ? Keep your shoulders over your hips. ? Keep your toes pointing forward. ? Make sure to use your hip muscles to lift your leg. Do not "throw" your leg or tip your body to lift your leg. 5. Hold this position for __________ seconds. 6. Slowly return to the starting position. Repeat __________ times. Complete this exercise __________ times a  day. Exercise J: Squats (Quadriceps) 1. Stand in a door frame so your feet and knees are in line with the frame. You may place your hands on the frame for balance. 2. Slowly bend your knees and lower your hips like you are going to sit in a chair. ? Keep your lower legs in a straight-up-and-down position. ? Do not let your hips go lower than your knees. ? Do not bend your knees lower than told by your health care provider. ? If your hip pain increases, do not bend as low. 3. Hold this position for ___________ seconds. 4. Slowly push with your legs to return to standing. Do not use your hands to pull yourself to standing. Repeat __________ times. Complete this exercise __________ times a day. This information is not intended to replace advice given to you by your health care provider. Make sure you discuss any questions you have with your health care provider. Document Released: 01/09/2005 Document Revised: 09/16/2015 Document Reviewed: 12/17/2014 Elsevier Interactive Patient Education  2018 [employer].  Back Exercises If you have pain in your back, do these exercises 2-3 times each day or as told by your doctor. When the pain goes away, do the exercises once each day, but repeat the steps more times for each exercise (do more repetitions). If you do not have pain in your back, do these exercises once each day or as told by your doctor. Exercises Single Knee to Chest  Do these steps 3-5 times in a row for each leg: 1. Lie on your back on a firm bed or the floor with your legs stretched out. 2. Bring one knee to your chest. 3. Hold your knee to your chest by grabbing your knee or thigh. 4. Pull on your knee until you feel a gentle stretch in your lower back. 5. Keep doing the stretch for 10-30 seconds. 6. Slowly let go of your leg and straighten it.  Pelvic Tilt  Do these steps 5-10 times in a row: 1. Lie on your back on a firm bed or the floor with your legs stretched out. 2. Bend  your knees so they point up to the ceiling. Your feet should be flat on the floor. 3. Tighten your lower belly (abdomen) muscles to press your lower back against the floor. This will make your tailbone point up to the ceiling  instead of pointing down to your feet or the floor. 4. Stay in this position for 5-10 seconds while you gently tighten your muscles and breathe evenly.  Cat-Cow  Do these steps until your lower back bends more easily: 1. Get on your hands and knees on a firm surface. Keep your hands under your shoulders, and keep your knees under your hips. You may put padding under your knees. 2. Let your head hang down, and make your tailbone point down to the floor so your lower back is round like the back of a cat. 3. Stay in this position for 5 seconds. 4. Slowly lift your head and make your tailbone point up to the ceiling so your back hangs low (sags) like the back of a cow. 5. Stay in this position for 5 seconds.  Press-Ups  Do these steps 5-10 times in a row: 1. Lie on your belly (face-down) on the floor. 2. Place your hands near your head, about shoulder-width apart. 3. While you keep your back relaxed and keep your hips on the floor, slowly straighten your arms to raise the top half of your body and lift your shoulders. Do not use your back muscles. To make yourself more comfortable, you may change where you place your hands. 4. Stay in this position for 5 seconds. 5. Slowly return to lying flat on the floor.  Bridges  Do these steps 10 times in a row: 1. Lie on your back on a firm surface. 2. Bend your knees so they point up to the ceiling. Your feet should be flat on the floor. 3. Tighten your butt muscles and lift your butt off of the floor until your waist is almost as high as your knees. If you do not feel the muscles working in your butt and the back of your thighs, slide your feet 1-2 inches farther away from your butt. 4. Stay in this position for 3-5  seconds. 5. Slowly lower your butt to the floor, and let your butt muscles relax.  If this exercise is too easy, try doing it with your arms crossed over your chest. Belly Crunches  Do these steps 5-10 times in a row: 1. Lie on your back on a firm bed or the floor with your legs stretched out. 2. Bend your knees so they point up to the ceiling. Your feet should be flat on the floor. 3. Cross your arms over your chest. 4. Tip your chin a little bit toward your chest but do not bend your neck. 5. Tighten your belly muscles and slowly raise your chest just enough to lift your shoulder blades a tiny bit off of the floor. 6. Slowly lower your chest and your head to the floor.  Back Lifts Do these steps 5-10 times in a row: 1. Lie on your belly (face-down) with your arms at your sides, and rest your forehead on the floor. 2. Tighten the muscles in your legs and your butt. 3. Slowly lift your chest off of the floor while you keep your hips on the floor. Keep the back of your head in line with the curve in your back. Look at the floor while you do this. 4. Stay in this position for 3-5 seconds. 5. Slowly lower your chest and your face to the floor.  Contact a doctor if:  Your back pain gets a lot worse when you do an exercise.  Your back pain does not lessen 2 hours after you exercise. If you  have any of these problems, stop doing the exercises. Do not do them again unless your doctor says it is okay. Get help right away if:  You have sudden, very bad back pain. If this happens, stop doing the exercises. Do not do them again unless your doctor says it is okay. This information is not intended to replace advice given to you by your health care provider. Make sure you discuss any questions you have with your health care provider. Document Released: 01/24/2010 Document Revised: 05/30/2015 Document Reviewed: 02/15/2014 Elsevier Interactive Patient Education  2018 [employer] 65 Years and Older, Male Preventive care refers to lifestyle choices and visits with your health care provider that can promote health and wellness. What does preventive care include?  A yearly physical exam. This is also called an annual well check.  Dental exams once or twice a year.  Routine eye exams. Ask your health care provider how often you should have your eyes checked.  Personal lifestyle choices, including: ? Daily care of your teeth and gums. ? Regular physical activity. ? Eating a healthy diet. ? Avoiding tobacco and drug use. ? Limiting alcohol use. ? Practicing safe sex. ? Taking low doses of aspirin every day. ? Taking vitamin and mineral supplements as recommended by your health care provider. What happens during an annual well check? The services and screenings done by your health care provider during your annual well check will depend on your age, overall health, lifestyle risk factors, and family history of disease. Counseling Your health care provider may ask you questions about your:  Alcohol use.  Tobacco use.  Drug use.  Emotional well-being.  Home and relationship well-being.  Sexual activity.  Eating habits.  History of falls.  Memory and ability to understand (cognition).  Work and work Statistician.  Screening You may have the following tests or measurements:  Height, weight, and BMI.  Blood pressure.  Lipid and cholesterol levels. These may be checked every 5 years, or more frequently if you are over 20 years old.  Skin check.  Lung cancer screening. You may have this screening every year starting at age 71 if you have a 30-pack-year history of smoking and currently smoke or have quit within the past 15 years.  Fecal occult blood test (FOBT) of the stool. You may have this test every year starting at age 53.  Flexible sigmoidoscopy or colonoscopy. You may have a sigmoidoscopy every 5 years or a colonoscopy every 10 years  starting at age 49.  Prostate cancer screening. Recommendations will vary depending on your family history and other risks.  Hepatitis C blood test.  Hepatitis B blood test.  Sexually transmitted disease (STD) testing.  Diabetes screening. This is done by checking your blood sugar (glucose) after you have not eaten for a while (fasting). You may have this done every 1-3 years.  Abdominal aortic aneurysm (AAA) screening. You may need this if you are a current or former smoker.  Osteoporosis. You may be screened starting at age 29 if you are at high risk.  Talk with your health care provider about your test results, treatment options, and if necessary, the need for more tests. Vaccines Your health care provider may recommend certain vaccines, such as:  Influenza vaccine. This is recommended every year.  Tetanus, diphtheria, and acellular pertussis (Tdap, Td) vaccine. You may need a Td booster every 10 years.  Varicella vaccine. You may need this if you have not been vaccinated.  Zoster vaccine. You may need this after age 11.  Measles, mumps, and rubella (MMR) vaccine. You may need at least one dose of MMR if you were born in 1957 or later. You may also need a second dose.  Pneumococcal 13-valent conjugate (PCV13) vaccine. One dose is recommended after age 7.  Pneumococcal polysaccharide (PPSV23) vaccine. One dose is recommended after age 65.  Meningococcal vaccine. You may need this if you have certain conditions.  Hepatitis A vaccine. You may need this if you have certain conditions or if you travel or work in places where you may be exposed to hepatitis A.  Hepatitis B vaccine. You may need this if you have certain conditions or if you travel or work in places where you may be exposed to hepatitis B.  Haemophilus influenzae type b (Hib) vaccine. You may need this if you have certain risk factors.  Talk to your health care provider about which screenings and vaccines you  need and how often you need them. This information is not intended to replace advice given to you by your health care provider. Make sure you discuss any questions you have with your health care provider. Document Released: 01/18/2015 Document Revised: 09/11/2015 Document Reviewed: 10/23/2014 Elsevier Interactive Patient Education  [employer].

## 2017-10-07 ENCOUNTER — Encounter: Payer: Self-pay | Admitting: Family Medicine

## 2017-10-07 DIAGNOSIS — I714 Abdominal aortic aneurysm, without rupture, unspecified: Secondary | ICD-10-CM | POA: Insufficient documentation

## 2017-11-08 ENCOUNTER — Other Ambulatory Visit: Payer: Self-pay | Admitting: Family Medicine

## 2017-11-08 ENCOUNTER — Telehealth: Payer: Self-pay | Admitting: Family Medicine

## 2017-11-08 DIAGNOSIS — M25552 Pain in left hip: Principal | ICD-10-CM

## 2017-11-08 DIAGNOSIS — G8929 Other chronic pain: Secondary | ICD-10-CM

## 2017-11-08 NOTE — Telephone Encounter (Signed)
Called and left message for pt to return call to office.  

## 2017-11-08 NOTE — Telephone Encounter (Signed)
Pt is still having hip pain and need a referral to see a specialist. Please advise pt.

## 2017-11-08 NOTE — Telephone Encounter (Signed)
Patient returned Sierra's call.  I let patient know Debbie's comments and patient voiced understanding.

## 2017-11-08 NOTE — Telephone Encounter (Signed)
Please let patient know that I have placed a referral for him to see one of the orthopedic specialists. He should get a call within the next couple of days about an appointment.

## 2017-11-11 ENCOUNTER — Encounter: Payer: Self-pay | Admitting: Gastroenterology

## 2017-11-11 ENCOUNTER — Other Ambulatory Visit: Payer: Self-pay

## 2017-11-11 ENCOUNTER — Ambulatory Visit: Payer: BLUE CROSS/BLUE SHIELD | Admitting: Gastroenterology

## 2017-11-11 VITALS — BP 152/76 | HR 76 | Ht 66.0 in | Wt 181.4 lb

## 2017-11-11 DIAGNOSIS — K5150 Left sided colitis without complications: Secondary | ICD-10-CM

## 2017-11-11 NOTE — Progress Notes (Signed)
Arlyss Repress, MD [address]  [address]  [place], [place] [address]  Main: [phone]  Fax: [phone]    Gastroenterology Consultation  Referring Provider:     Emi Belfast, FNP Primary Care Physician:  Emi Belfast, FNP Primary Gastroenterologist:  Dr. Arlyss Repress Reason for Consultation:  UC        HPI:   Gregory Obrien is a 67 y.o. Caucasian male referred by Dr. Leone Payor, Binnie Rail, FNP  for consultation & management of UC  UC diagnosed 43yrs ago, left sided colitis, after having rectal urgency, diarrhea, weight loss, abdominal pain.  He had multiple flareups during that time, Steroid responsive Tried remicade approximately 7 or 8 years ago, developed allergic reaction, stopped Was previously on topical and oral mesalamine Not on any therapy for 3 years In clinical remission at present Reports having 1-2 formed bowel movements daily, he gained about 20 pounds. He used to see Dr. Susanne Greenhouse in [place], moved to [place].  Therefore, switched his care here. Patient denies any abdominal surgeries  CBC from 03/2017 normal, CMP in 04/2017 normal  NSAIDs: none  Antiplts/Anticoagulants/Anti thrombotics: none  GI Procedures: colonoscopy 2016, left sided colitis Pathology revealed mild chronic active left-sided colitis Random biopsies from the right colon were unremarkable, terminal ileum normal Had a colonoscopy in 2012, decreased vasculature in entire colon Pathology report not available    Past Medical History:  Diagnosis Date  . Blood in stool   . History of colon polyps   . UC (ulcerative colitis) (HCC)   . Ulcerative colitis (HCC)    remission since 2017, see Dr. Beatrix Fetters    Past Surgical History:  Procedure Laterality Date  . BACK SURGERY    . KNEE SURGERY      Current Outpatient Medications:  .  meclizine (ANTIVERT) 25 MG tablet, Take 1 tablet (25 mg total) by mouth 3 (three) times daily as needed for dizziness or nausea., Disp:  30 tablet, Rfl: 0 .  pravastatin (PRAVACHOL) 40 MG tablet, Take 1 tablet (40 mg total) by mouth daily., Disp: 90 tablet, Rfl: 3 .  sildenafil (REVATIO) 20 MG tablet, 2-5 tabs 1 hour prior to intercourse, Disp: 10 tablet, Rfl: 1    Family History  Problem Relation Age of Onset  . Heart disease Father   . Heart attack Father   . Drug abuse Brother   . Early death Brother      Social History   Tobacco Use  . Smoking status: Former Games developer  . Smokeless tobacco: Never Used  Substance Use Topics  . Alcohol use: Yes  . Drug use: Never    Allergies as of 11/11/2017 - Review Complete 11/11/2017  Allergen Reaction Noted  . Infliximab Hives 03/29/2017    Review of Systems:    All systems reviewed and negative except where noted in HPI.   Physical Exam:  BP (!) 152/76   Pulse 76   Ht 5\' 6"  (1.676 m)   Wt 181 lb 6.4 oz (82.3 kg)   BMI 29.28 kg/m  No LMP for male patient.  General:   Alert,  Well-developed, well-nourished, pleasant and cooperative in NAD Head:  Normocephalic and atraumatic. Eyes:  Sclera clear, no icterus.   Conjunctiva pink. Ears:  Normal auditory acuity. Nose:  No deformity, discharge, or lesions. Mouth:  No deformity or lesions,oropharynx pink & moist. Neck:  Supple; no masses or thyromegaly. Lungs:  Respirations even and unlabored.  Clear throughout to auscultation.   No  wheezes, crackles, or rhonchi. No acute distress. Heart:  Regular rate and rhythm; no murmurs, clicks, rubs, or gallops. Abdomen:  Normal bowel sounds. Soft, non-tender and non-distended without masses, hepatosplenomegaly or hernias noted.  No guarding or rebound tenderness.   Rectal: Not performed Msk:  Symmetrical without gross deformities. Good, equal movement & strength bilaterally. Pulses:  Normal pulses noted. Extremities:  No clubbing or edema.  No cyanosis. Neurologic:  Alert and oriented x3;  grossly normal neurologically. Skin:  Intact without significant lesions or rashes. No  jaundice. Lymph Nodes:  No significant cervical adenopathy. Psych:  Alert and cooperative. Normal mood and affect.  Imaging Studies: None  Assessment and Plan:   Gregory Obrien is a 67 y.o. with Caucasian male with left-sided UC, previously on Remicade developed allergic reaction, diagnosed approximately 10 years ago, currently off maintenance therapy for the last 3 years in clinical remission.  Last colonoscopy in 2016 revealed mild chronic active left-sided UC.  Due to presence of active inflammation, and duration of disease, I discussed with patient about another colonoscopy to assess severity of inflammation and as well as dysplasia surveillance.  Patient is agreeable to undergo by the end of this year  I have discussed alternative options, risks & benefits,  which include, but are not limited to, bleeding, infection, perforation,respiratory complication & drug reaction.  The patient agrees with this plan & written consent will be obtained.     Follow up in 3 months   Arlyss Repress, MD

## 2017-11-17 ENCOUNTER — Other Ambulatory Visit: Payer: Self-pay

## 2017-11-17 MED ORDER — NA SULFATE-K SULFATE-MG SULF 17.5-3.13-1.6 GM/177ML PO SOLN: 1.0000 | Freq: Once | ORAL | 0 refills | Status: AC

## 2017-11-23 DIAGNOSIS — M7062 Trochanteric bursitis, left hip: Secondary | ICD-10-CM | POA: Diagnosis not present

## 2017-11-24 ENCOUNTER — Encounter: Payer: Self-pay | Admitting: Emergency Medicine

## 2017-11-25 ENCOUNTER — Encounter: Payer: Self-pay | Admitting: *Deleted

## 2017-11-25 ENCOUNTER — Encounter
Admission: RE | Disposition: A | Discharge status: Home / Self Care | Payer: Self-pay | Source: Ambulatory Visit | Attending: Gastroenterology

## 2017-11-25 ENCOUNTER — Ambulatory Visit: Payer: BLUE CROSS/BLUE SHIELD | Admitting: Anesthesiology

## 2017-11-25 ENCOUNTER — Ambulatory Visit
Admission: RE | Admit: 2017-11-25 | Discharge: 2017-11-25 | Disposition: A | Discharge status: Home / Self Care | Payer: BLUE CROSS/BLUE SHIELD | Source: Ambulatory Visit | Attending: Gastroenterology | Admitting: Gastroenterology

## 2017-11-25 DIAGNOSIS — Z79899 Other long term (current) drug therapy: Secondary | ICD-10-CM | POA: Insufficient documentation

## 2017-11-25 DIAGNOSIS — Z86010 Personal history of colonic polyps: Secondary | ICD-10-CM | POA: Diagnosis not present

## 2017-11-25 DIAGNOSIS — K5150 Left sided colitis without complications: Secondary | ICD-10-CM

## 2017-11-25 DIAGNOSIS — K635 Polyp of colon: Secondary | ICD-10-CM | POA: Diagnosis not present

## 2017-11-25 DIAGNOSIS — K5730 Diverticulosis of large intestine without perforation or abscess without bleeding: Secondary | ICD-10-CM | POA: Diagnosis not present

## 2017-11-25 DIAGNOSIS — Z87891 Personal history of nicotine dependence: Secondary | ICD-10-CM | POA: Insufficient documentation

## 2017-11-25 HISTORY — PX: COLONOSCOPY WITH PROPOFOL: SHX5780

## 2017-11-25 SURGERY — COLONOSCOPY WITH PROPOFOL
Anesthesia: General

## 2017-11-25 MED ORDER — PROPOFOL 500 MG/50ML IV EMUL
INTRAVENOUS | Status: DC | PRN
  Administered 2017-11-25: 175 ug/kg/min via INTRAVENOUS

## 2017-11-25 MED ORDER — PROPOFOL 10 MG/ML IV BOLUS
INTRAVENOUS | Status: DC | PRN
  Administered 2017-11-25: 80 mg via INTRAVENOUS

## 2017-11-25 MED ORDER — LIDOCAINE HCL (CARDIAC) PF 100 MG/5ML IV SOSY
PREFILLED_SYRINGE | INTRAVENOUS | Status: DC | PRN
  Administered 2017-11-25: 50 mg via INTRAVENOUS

## 2017-11-25 MED ORDER — SODIUM CHLORIDE 0.9 % IV SOLN
INTRAVENOUS | Status: DC
  Administered 2017-11-25: 11:00:00 via INTRAVENOUS

## 2017-11-25 MED ORDER — PROPOFOL 500 MG/50ML IV EMUL
INTRAVENOUS | Status: AC
  Filled 2017-11-25: qty 50

## 2017-11-25 MED ORDER — LIDOCAINE HCL (PF) 2 % IJ SOLN
INTRAMUSCULAR | Status: AC
  Filled 2017-11-25: qty 10

## 2017-11-25 NOTE — Anesthesia Post-op Follow-up Note (Signed)
Anesthesia QCDR form completed.        

## 2017-11-25 NOTE — Transfer of Care (Signed)
Immediate Anesthesia Transfer of Care Note  Patient: Gregory Obrien  Procedure(s) Performed: COLONOSCOPY WITH PROPOFOL (N/A )  Patient Location: PACU  Anesthesia Type:General  Level of Consciousness: sedated  Airway & Oxygen Therapy: Patient Spontanous Breathing and Patient connected to nasal cannula oxygen  Post-op Assessment: Report given to RN and Post -op Vital signs reviewed and stable  Post vital signs: Reviewed and stable  Last Vitals:  Vitals Value Taken Time  BP 85/70 11/25/2017 12:04 PM  Temp 36.1 C 11/25/2017 12:04 PM  Pulse 77 11/25/2017 12:05 PM  Resp 18 11/25/2017 12:05 PM  SpO2 95 % 11/25/2017 12:05 PM  Vitals shown include unvalidated device data.  Last Pain:  Vitals:   11/25/17 1204  TempSrc: Tympanic  PainSc:          Complications: No apparent anesthesia complications

## 2017-11-25 NOTE — Anesthesia Procedure Notes (Signed)
Date/Time: 11/25/2017 11:30 AM Performed by: Ginger CarneMichelet, Lauralie Blacksher, CRNA Pre-anesthesia Checklist: Patient identified, Emergency Drugs available, Suction available, Patient being monitored and Timeout performed Patient Re-evaluated:Patient Re-evaluated prior to induction Oxygen Delivery Method: Nasal cannula Preoxygenation: Pre-oxygenation with 100% oxygen Induction Type: IV induction

## 2017-11-25 NOTE — Op Note (Addendum)
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 Estimated blood loss:                        Minimal. Procedure:            Pre-Anesthesia Assessment:                       - Prior to the procedure, a History and Physical was                        performed, and patient medications and allergies were                        reviewed. The patient is competent. The risks and                        benefits of the procedure and the sedation options and                        risks were discussed with the patient. All questions                        were answered and informed consent was obtained.                        Patient identification and proposed procedure were                        verified by the physician, the nurse, the                        anesthesiologist, the anesthetist and the technician in                        the pre-procedure area in the procedure room in the                        endoscopy suite. Mental Status Examination: alert and  oriented. Airway Examination: normal oropharyngeal                        airway and neck mobility. Respiratory Examination:                        clear to auscultation. CV Examination: normal.            Prophylactic Antibiotics: The patient does not require                        prophylactic antibiotics. Prior Anticoagulants: The                        patient has taken no previous anticoagulant or                        antiplatelet agents. ASA Grade Assessment: II - A                        patient with mild systemic disease. After reviewing the                        risks and benefits, the patient was deemed in                        satisfactory condition to undergo the procedure. The                        anesthesia plan was to use monitored anesthesia care                        (MAC). Immediately prior to administration of                        medications, the patient was re-assessed for adequacy                        to receive sedatives. The heart rate, respiratory rate,                        oxygen saturations, blood pressure, adequacy of                        pulmonary ventilation, and response to care were                        monitored throughout the procedure. The physical status                        of the patient was re-assessed after the procedure.                       After obtaining informed consent, the colonoscope was                        passed under direct vision. Throughout the procedure,                        the patient's blood pressure, pulse, and oxygen  saturations were monitored continuously. The                        Colonoscope was introduced through the anus and                        advanced to the the terminal ileum, with identification                        of the appendiceal orifice and IC valve. The                        colonoscopy was performed without difficulty. The                        patient tolerated the procedure well. The quality of                        the bowel preparation was evaluated using the BBPS                        [facility] Bowel Preparation Scale) with scores of: Right                         Colon = 3, Transverse Colon = 3 and Left Colon = 3                        (entire mucosa seen well with no residual staining,                        small fragments of stool or opaque liquid). The total                        BBPS score equals 9. Findings:      The perianal and digital rectal examinations were normal. Pertinent       negatives include normal sphincter tone and no palpable rectal lesions.      The terminal ileum appeared normal.      Inflammation was not found based on the endoscopic appearance of the       mucosa in the colon. Diffuse scarring in entire colon from ulcerative       colitis. This was graded as Mayo Score 0 (normal or inactive disease),       and when compared to the previous examination, the findings are in       remission. Segmental Biopsies were taken with a cold forceps for       histology and dysplasia surveillance. Four biopsies were taken every 10       cm with a cold forceps from the entire colon for ulcerative colitis       surveillance. These biopsy specimens from the cecum, ascending colon,       transverse colon, descending colon, sigmoid colon and rectum were sent       to Pathology.      The retroflexed view of the distal rectum and anal verge was normal and       showed no anal or rectal abnormalities. Impression:           - The examined portion of the ileum was normal.                       -  Inactive (Mayo Score 0) ulcerative colitis, in                        remission, in remission since the last examination.                        Biopsied.                       - The distal rectum and anal verge are normal on                        retroflexion view. Recommendation:       - Discharge patient to home (with spouse).                       - Resume previous diet today.                       - Continue present medications.                       - Await pathology results.                       - Repeat colonoscopy date to be  determined after                        pending pathology results are reviewed for surveillance                        based on pathology results.                       - Return to my office as previously scheduled. Procedure Code(s):    --- Professional ---                       [phone], Colonoscopy, flexible; with biopsy, single or                        multiple Diagnosis Code(s):    --- Professional ---                       K51.50, Left sided colitis without complications CPT copyright 2018 American Medical Association. All rights reserved. The codes documented in this report are preliminary and upon coder review may  be revised to meet current compliance requirements. Dr. Ulyess Mort Lin Landsman MD, MD 11/25/2017 12:04:52 PM This report has been signed electronically. Number of Addenda: 0 Note Initiated On: 11/25/2017 11:24 AM Scope Withdrawal Time: 0 hours 21 minutes 24 seconds  Total Procedure Duration: 0 hours 23 minutes 47 seconds       [facility]

## 2017-11-25 NOTE — H&P (Signed)
Arlyss Repressohini R Zeven Kocak, MD [address]  [address]  [place], [place] [address]  Main: [phone]  Fax: [phone] Pager: [phone]  Primary Care Physician:  Emi BelfastGessner, Deborah B, FNP Primary Gastroenterologist:  Dr. Arlyss Repressohini R Migel Hannis  Pre-Procedure History & Physical: HPI:  Gregory Obrien is a [age] y.o. male is here for an colonoscopy.   Past Medical History:  Diagnosis Date  . Blood in stool   . History of colon polyps   . UC (ulcerative colitis) (HCC)   . Ulcerative colitis (HCC)    remission since 2017, see Dr. Beatrix FettersNanahari    Past Surgical History:  Procedure Laterality Date  . BACK SURGERY    . KNEE SURGERY      Prior to Admission medications   Medication Sig Start Date End Date Taking? Authorizing Provider  pravastatin (PRAVACHOL) 40 MG tablet Take 1 tablet (40 mg total) by mouth daily. 09/29/17  Yes Emi BelfastGessner, Deborah B, FNP  sildenafil (REVATIO) 20 MG tablet 2-5 tabs 1 hour prior to intercourse 06/09/17  Yes Stoioff, Verna CzechScott C, MD  meclizine (ANTIVERT) 25 MG tablet Take 1 tablet (25 mg total) by mouth 3 (three) times daily as needed for dizziness or nausea. Patient not taking: Reported on 11/25/2017 07/07/17   Judy Pimpleower, Marne A, MD    Allergies as of 11/12/2017 - Review Complete 11/11/2017  Allergen Reaction Noted  . Infliximab Hives 03/29/2017    Family History  Problem Relation Age of Onset  . Heart disease Father   . Heart attack Father   . Drug abuse Brother   . Early death Brother     Social History   Socioeconomic History  . Marital status: Married    Spouse name: Not on file  . Number of children: Not on file  . Years of education: Not on file  . Highest education level: Not on file  Occupational History  . Not on file  Social Needs  . Financial resource strain: Not on file  . Food insecurity:    Worry: Not on file    Inability: Not on file  . Transportation needs:    Medical: Not on file    Non-medical: Not on file  Tobacco Use  . Smoking status:  Former Games developermoker  . Smokeless tobacco: Never Used  Substance and Sexual Activity  . Alcohol use: Yes  . Drug use: Never  . Sexual activity: Yes  Lifestyle  . Physical activity:    Days per week: Not on file    Minutes per session: Not on file  . Stress: Not on file  Relationships  . Social connections:    Talks on phone: Not on file    Gets together: Not on file    Attends religious service: Not on file    Active member of club or organization: Not on file    Attends meetings of clubs or organizations: Not on file    Relationship status: Not on file  . Intimate partner violence:    Fear of current or ex partner: Not on file    Emotionally abused: Not on file    Physically abused: Not on file    Forced sexual activity: Not on file  Other Topics Concern  . Not on file  Social History Narrative   Married to Smith InternationalKellie Gallagher   Works as Production designer, theatre/television/filmadministrator at [employer]   Has dogs   Enjoys basketball    Review of Systems: See HPI, otherwise negative ROS  Physical Exam: BP 128/82  Pulse 61   Temp 98.5 F (36.9 C) (Tympanic)   Resp 18   Ht 5\' 6"  (1.676 m)   Wt 81.6 kg   SpO2 96%   BMI 29.05 kg/m  General:   Alert,  pleasant and cooperative in NAD Head:  Normocephalic and atraumatic. Neck:  Supple; no masses or thyromegaly. Lungs:  Clear throughout to auscultation.    Heart:  Regular rate and rhythm. Abdomen:  Soft, nontender and nondistended. Normal bowel sounds, without guarding, and without rebound.   Neurologic:  Alert and  oriented x4;  grossly normal neurologically.  Impression/Plan: Ayham Word is here for an colonoscopy to be performed for h/o UC  Risks, benefits, limitations, and alternatives regarding  colonoscopy have been reviewed with the patient.  Questions have been answered.  All parties agreeable.   Lannette Donath, MD  11/25/2017, 11:01 AM

## 2017-11-25 NOTE — Anesthesia Preprocedure Evaluation (Signed)
Anesthesia Evaluation  Patient identified by MRN, date of birth, ID band Patient awake    Reviewed: Allergy & Precautions, H&P , NPO status , Patient's Chart, lab work & pertinent test results  History of Anesthesia Complications Negative for: history of anesthetic complications  Airway Mallampati: III  TM Distance: <3 FB Neck ROM: limited    Dental  (+) Chipped, Missing   Pulmonary neg shortness of breath, former smoker,           Cardiovascular Exercise Tolerance: Good (-) angina(-) Past MI and (-) DOE negative cardio ROS       Neuro/Psych negative neurological ROS  negative psych ROS   GI/Hepatic Neg liver ROS, PUD,   Endo/Other  negative endocrine ROS  Renal/GU negative Renal ROS  negative genitourinary   Musculoskeletal   Abdominal   Peds  Hematology negative hematology ROS (+)   Anesthesia Other Findings Past Medical History: No date: Blood in stool No date: History of colon polyps No date: UC (ulcerative colitis) (HCC) No date: Ulcerative colitis (HCC)     Comment:  remission since 2017, see Dr. Beatrix FettersNanahari  Past Surgical History: No date: BACK SURGERY No date: KNEE SURGERY     Reproductive/Obstetrics negative OB ROS                             Anesthesia Physical Anesthesia Plan  ASA: II  Anesthesia Plan: General   Post-op Pain Management:    Induction: Intravenous  PONV Risk Score and Plan: Propofol infusion and TIVA  Airway Management Planned: Natural Airway and Nasal Cannula  Additional Equipment:   Intra-op Plan:   Post-operative Plan:   Informed Consent: I have reviewed the patients History and Physical, chart, labs and discussed the procedure including the risks, benefits and alternatives for the proposed anesthesia with the patient or authorized representative who has indicated his/her understanding and acceptance.   Dental Advisory Given  Plan  Discussed with: Anesthesiologist, CRNA and Surgeon  Anesthesia Plan Comments: (Patient consented for risks of anesthesia including but not limited to:  - adverse reactions to medications - risk of intubation if required - damage to teeth, lips or other oral mucosa - sore throat or hoarseness - Damage to heart, brain, lungs or loss of life  Patient voiced understanding.)        Anesthesia Quick Evaluation

## 2017-11-26 ENCOUNTER — Encounter: Payer: Self-pay | Admitting: Gastroenterology

## 2017-11-26 NOTE — Anesthesia Postprocedure Evaluation (Signed)
Anesthesia Post Note  Patient: Gregory Obrien  Procedure(s) Performed: COLONOSCOPY WITH PROPOFOL (N/A )  Patient location during evaluation: Endoscopy Anesthesia Type: General Level of consciousness: awake and alert Pain management: pain level controlled Vital Signs Assessment: post-procedure vital signs reviewed and stable Respiratory status: spontaneous breathing, nonlabored ventilation, respiratory function stable and patient connected to nasal cannula oxygen Cardiovascular status: blood pressure returned to baseline and stable Postop Assessment: no apparent nausea or vomiting Anesthetic complications: no     Last Vitals:  Vitals:   11/25/17 1048 11/25/17 1204  BP: 128/82 (!) 85/70  Pulse: 61   Resp: 18   Temp: 36.9 C (!) 36.1 C  SpO2: 96%     Last Pain:  Vitals:   11/25/17 1204  TempSrc: Tympanic  PainSc:                  Gregory Obrien

## 2017-11-29 LAB — SURGICAL PATHOLOGY

## 2017-12-22 ENCOUNTER — Ambulatory Visit: Payer: BLUE CROSS/BLUE SHIELD | Admitting: Family Medicine

## 2017-12-22 ENCOUNTER — Encounter: Payer: Self-pay | Admitting: Family Medicine

## 2017-12-22 VITALS — BP 138/86 | HR 80 | Temp 97.9°F | Ht 66.0 in | Wt 177.8 lb

## 2017-12-22 DIAGNOSIS — L249 Irritant contact dermatitis, unspecified cause: Secondary | ICD-10-CM

## 2017-12-22 MED ORDER — TRIAMCINOLONE ACETONIDE 0.1 % EX CREA: 1.0000 "application " | TOPICAL_CREAM | Freq: Two times a day (BID) | CUTANEOUS | 0 refills | Status: DC

## 2017-12-22 NOTE — Progress Notes (Signed)
   Subjective:    Patient ID: Gregory RossettiPaul Obrien, male    DOB: 04/06/1950, 67 y.o.   MRN: [id number]  HPI This is a 67 yo male who presents today with concern for itchy area on his back x 1 month. Has noticed it is worse with hot shower. No pain. No new soaps, lotions, shampoos, detergents, etc.  Has been doing well. Walking more which is helping with hip pain.  Had colonoscopy for UC; repeat in 7 years.   Past Medical History:  Diagnosis Date  . Blood in stool   . History of colon polyps   . UC (ulcerative colitis) (HCC)   . Ulcerative colitis (HCC)    remission since 2017, see Dr. Beatrix FettersNanahari   Past Surgical History:  Procedure Laterality Date  . BACK SURGERY    . COLONOSCOPY WITH PROPOFOL N/A 11/25/2017   Procedure: COLONOSCOPY WITH PROPOFOL;  Surgeon: Gregory Obrien, Rohini Reddy, MD;  [facility];  Service: Gastroenterology;  Laterality: N/A;  . KNEE SURGERY     Family History  Problem Relation Age of Onset  . Heart disease Father   . Heart attack Father   . Drug abuse Brother   . Early death Brother    Social History   Tobacco Use  . Smoking status: Former Games developermoker  . Smokeless tobacco: Never Used  Substance Use Topics  . Alcohol use: Yes  . Drug use: Never      Review of Systems Per HPI    Objective:   Physical Exam Vitals signs reviewed.  Constitutional:      Appearance: Normal appearance. He is normal weight.  HENT:     Head: Normocephalic and atraumatic.  Cardiovascular:     Rate and Rhythm: Normal rate.  Pulmonary:     Effort: Pulmonary effort is normal.  Skin:    General: Skin is warm and dry.     Findings: Rash present.       Neurological:     Mental Status: He is alert.  Psychiatric:        Mood and Affect: Mood normal.        Behavior: Behavior normal.        Thought Content: Thought content normal.        Judgment: Judgment normal.       BP 138/86 (BP Location: Right Arm, Patient Position: Sitting, Cuff Size: Normal)   Pulse 80    Temp 97.9 F (36.6 C) (Oral)   Ht 5\' 6"  (1.676 m)   Wt 177 lb 12.8 oz (80.6 kg)   SpO2 96%   BMI 28.70 kg/m  Wt Readings from Last 3 Encounters:  12/22/17 177 lb 12.8 oz (80.6 kg)  11/25/17 180 lb (81.6 kg)  11/11/17 181 lb 6.4 oz (82.3 kg)       Assessment & Plan:  1. Irritant contact dermatitis, unspecified trigger - Provided written and verbal information regarding diagnosis and treatment. - RTC precautions reviewed - triamcinolone cream (KENALOG) 0.1 %; Apply 1 application topically 2 (two) times daily. For up to 10 days.  Dispense: 30 g; Refill: 0   Olean Reeeborah Gessner, FNP-BC  [facility]  12/22/2017 6:44 PM

## 2017-12-22 NOTE — Patient Instructions (Signed)
Good to see you today  I have sent a steroid cream to your pharmacy- use on affected area for up to 10 days, twice a day

## 2018-10-03 ENCOUNTER — Other Ambulatory Visit: Payer: Self-pay | Admitting: Family Medicine

## 2018-10-03 DIAGNOSIS — E782 Mixed hyperlipidemia: Secondary | ICD-10-CM

## 2018-10-05 NOTE — Telephone Encounter (Signed)
Patient scheduled cpx on 10/28/18.  Can patient's medication be refilled until he comes in for appointment?

## 2018-10-06 MED ORDER — PRAVASTATIN SODIUM 40 MG PO TABS: 40.0000 mg | ORAL_TABLET | Freq: Every day | ORAL | 0 refills | Status: DC

## 2018-10-06 NOTE — Addendum Note (Signed)
Addended by: Kris Mouton on: 10/06/2018 02:19 PM   Modules accepted: Orders

## 2018-10-06 NOTE — Telephone Encounter (Signed)
Pravastatin refilled.  

## 2018-10-24 ENCOUNTER — Other Ambulatory Visit: Payer: Self-pay | Admitting: Family Medicine

## 2018-10-24 ENCOUNTER — Encounter: Payer: Self-pay | Admitting: Family Medicine

## 2018-10-24 DIAGNOSIS — Z1159 Encounter for screening for other viral diseases: Secondary | ICD-10-CM

## 2018-10-24 DIAGNOSIS — R7303 Prediabetes: Secondary | ICD-10-CM

## 2018-10-24 DIAGNOSIS — Z125 Encounter for screening for malignant neoplasm of prostate: Secondary | ICD-10-CM

## 2018-10-24 DIAGNOSIS — E782 Mixed hyperlipidemia: Secondary | ICD-10-CM

## 2018-10-28 ENCOUNTER — Encounter: Payer: Self-pay | Admitting: Family Medicine

## 2018-10-28 ENCOUNTER — Ambulatory Visit (INDEPENDENT_AMBULATORY_CARE_PROVIDER_SITE_OTHER): Payer: BC Managed Care – PPO | Admitting: Family Medicine

## 2018-10-28 ENCOUNTER — Other Ambulatory Visit: Payer: Self-pay

## 2018-10-28 VITALS — BP 130/78 | HR 71 | Temp 97.7°F | Ht 66.5 in | Wt 179.1 lb

## 2018-10-28 DIAGNOSIS — R7303 Prediabetes: Secondary | ICD-10-CM

## 2018-10-28 DIAGNOSIS — I714 Abdominal aortic aneurysm, without rupture, unspecified: Secondary | ICD-10-CM

## 2018-10-28 DIAGNOSIS — E782 Mixed hyperlipidemia: Secondary | ICD-10-CM | POA: Diagnosis not present

## 2018-10-28 DIAGNOSIS — Z125 Encounter for screening for malignant neoplasm of prostate: Secondary | ICD-10-CM | POA: Diagnosis not present

## 2018-10-28 DIAGNOSIS — Z0000 Encounter for general adult medical examination without abnormal findings: Secondary | ICD-10-CM

## 2018-10-28 DIAGNOSIS — Z23 Encounter for immunization: Secondary | ICD-10-CM

## 2018-10-28 DIAGNOSIS — Z1159 Encounter for screening for other viral diseases: Secondary | ICD-10-CM | POA: Diagnosis not present

## 2018-10-28 LAB — COMPREHENSIVE METABOLIC PANEL
ALT: 18 U/L (ref 0–53)
AST: 19 U/L (ref 0–37)
Albumin: 4.6 g/dL (ref 3.5–5.2)
Alkaline Phosphatase: 59 U/L (ref 39–117)
BUN: 20 mg/dL (ref 6–23)
CO2: 28 mEq/L (ref 19–32)
Calcium: 9.8 mg/dL (ref 8.4–10.5)
Chloride: 103 mEq/L (ref 96–112)
Creatinine, Ser: 1.23 mg/dL (ref 0.40–1.50)
GFR: 58.7 mL/min — ABNORMAL LOW (ref >60.00)
Glucose, Bld: 95 mg/dL (ref 70–99)
Potassium: 4.2 mEq/L (ref 3.5–5.1)
Sodium: 138 mEq/L (ref 135–145)
Total Bilirubin: 0.4 mg/dL (ref 0.2–1.2)
Total Protein: 7.5 g/dL (ref 6.0–8.3)

## 2018-10-28 LAB — CBC WITH DIFFERENTIAL/PLATELET
Basophils Absolute: 0.1 10*3/uL (ref 0.0–0.1)
Basophils Relative: 1.2 % (ref 0.0–3.0)
Eosinophils Absolute: 1 10*3/uL — ABNORMAL HIGH (ref 0.0–0.7)
Eosinophils Relative: 12.4 % — ABNORMAL HIGH (ref 0.0–5.0)
HCT: 43.4 % (ref 39.0–52.0)
Hemoglobin: 14.2 g/dL (ref 13.0–17.0)
Lymphocytes Relative: 28.2 % (ref 12.0–46.0)
Lymphs Abs: 2.3 10*3/uL (ref 0.7–4.0)
MCHC: 32.7 g/dL (ref 30.0–36.0)
MCV: 87.3 fl (ref 78.0–100.0)
Monocytes Absolute: 0.8 10*3/uL (ref 0.1–1.0)
Monocytes Relative: 9.6 % (ref 3.0–12.0)
Neutro Abs: 4 10*3/uL (ref 1.4–7.7)
Neutrophils Relative %: 48.6 % (ref 43.0–77.0)
Platelets: 320 10*3/uL (ref 150.0–400.0)
RBC: 4.97 Mil/uL (ref 4.22–5.81)
RDW: 14.6 % (ref 11.5–15.5)
WBC: 8.2 10*3/uL (ref 4.0–10.5)

## 2018-10-28 LAB — LIPID PANEL
Cholesterol: 286 mg/dL — ABNORMAL HIGH (ref 0–200)
HDL: 48.2 mg/dL (ref >39.00)
LDL Cholesterol: 204 mg/dL — ABNORMAL HIGH (ref 0–99)
NonHDL: 237.72
Total CHOL/HDL Ratio: 6
Triglycerides: 171 mg/dL — ABNORMAL HIGH (ref 0.0–149.0)
VLDL: 34.2 mg/dL (ref 0.0–40.0)

## 2018-10-28 LAB — PSA: PSA: 0.28 ng/mL (ref 0.10–4.00)

## 2018-10-28 LAB — HEMOGLOBIN A1C: Hgb A1c MFr Bld: 6.1 % (ref 4.6–6.5)

## 2018-10-28 NOTE — Progress Notes (Signed)
Subjective:    Patient ID: Gregory Obrien, male    DOB: 03-03-1950, 68 y.o.   MRN: [id number]  HPI This is a 68 yo male who presents today for CPE. Has been doing ok. Work is mostly Hospital doctor, busy.    Last CPE- 09/2017 PSA- 03/29/2017 Colonoscopy- 11/25/2017 Tdap- unknown Flu- annual Dental- regular Eye-recnt Exercise- walking at work  Back pain increased recently. Previous L4-5 surgery. Sore in the mornings. Better as day goes on. No meds. Has had PT in past. No regular stretching or exercise.   Poor sleep- difficulty staying asleep. No snoring or apnea. A little fatigue, no problems functioning in day to day activities.    Past Medical History:  Diagnosis Date  . Blood in stool   . History of colon polyps   . UC (ulcerative colitis) ([place])   . Ulcerative colitis ([place])    remission since 2017, see Dr. Willow Ora   Past Surgical History:  Procedure Laterality Date  . BACK SURGERY    . COLONOSCOPY WITH PROPOFOL N/A 11/25/2017   Procedure: COLONOSCOPY WITH PROPOFOL;  Surgeon: Lin Landsman, MD;  Location: [facility] ENDOSCOPY;  Service: Gastroenterology;  Laterality: N/A;  . KNEE SURGERY     Family History  Problem Relation Age of Onset  . Heart disease Father   . Heart attack Father   . Drug abuse Brother   . Early death Brother    Social History   Tobacco Use  . Smoking status: Former Research scientist (life sciences)  . Smokeless tobacco: Never Used  Substance Use Topics  . Alcohol use: Yes  . Drug use: Never      Review of Systems  Constitutional: Positive for fatigue.  HENT: Negative.   Eyes: Negative.   Respiratory: Negative.   Cardiovascular: Negative.   Gastrointestinal: Negative.   Endocrine: Negative.   Genitourinary: Negative.   Musculoskeletal: Positive for back pain.  Skin: Negative.   Allergic/Immunologic: Negative.   Neurological: Negative.   Hematological: Negative.   Psychiatric/Behavioral: Positive for sleep disturbance. Negative for dysphoric mood. The patient  is not nervous/anxious.        Objective:   Physical Exam Vitals signs reviewed.  Constitutional:      General: He is not in acute distress.    Appearance: Normal appearance. He is normal weight. He is not ill-appearing, toxic-appearing or diaphoretic.  HENT:     Head: Normocephalic and atraumatic.     Nose: Nose normal.     Mouth/Throat:     Mouth: Mucous membranes are moist.     Pharynx: Oropharynx is clear.  Eyes:     Conjunctiva/sclera: Conjunctivae normal.  Neck:     Musculoskeletal: Normal range of motion and neck supple.  Cardiovascular:     Rate and Rhythm: Normal rate and regular rhythm.     Heart sounds: Normal heart sounds.  Pulmonary:     Effort: Pulmonary effort is normal.     Breath sounds: Normal breath sounds.  Abdominal:     General: Abdomen is flat. Bowel sounds are normal. There is no distension.     Palpations: Abdomen is soft. There is no mass.     Tenderness: There is no abdominal tenderness. There is no guarding or rebound.     Hernia: No hernia is present.  Musculoskeletal:     Lumbar back: He exhibits normal range of motion, no tenderness, no bony tenderness and no swelling.     Right lower leg: No edema.     Left lower leg: No  edema.     Comments: Lower back with well healed surgical scar.   Skin:    General: Skin is warm and dry.  Neurological:     Mental Status: He is alert and oriented to person, place, and time.  Psychiatric:        Mood and Affect: Mood normal.        Behavior: Behavior normal.        Thought Content: Thought content normal.        Judgment: Judgment normal.       BP 130/78 (BP Location: Left Arm, Patient Position: Sitting, Cuff Size: Normal)   Pulse 71   Temp 97.7 F (36.5 C) (Temporal)   Ht 5' 6.5" (1.689 m)   Wt 179 lb 1.9 oz (81.2 kg)   SpO2 97%   BMI 28.48 kg/m  Wt Readings from Last 3 Encounters:  10/28/18 179 lb 1.9 oz (81.2 kg)  12/22/17 177 lb 12.8 oz (80.6 kg)  11/25/17 180 lb (81.6 kg)    Depression screen [facility] 2/9 10/28/2018 09/29/2017 03/29/2017  Decreased Interest 0 0 0  Down, Depressed, Hopeless 0 0 0  PHQ - 2 Score 0 0 0       Assessment & Plan:  1. Annual physical exam - Discussed and encouraged healthy lifestyle choices- adequate sleep, regular exercise, stress management and healthy food choices.  - encouraged increased stretching/yoga, discussed sleep hygiene  2. Screening for prostate cancer - PSA  3. Encounter for hepatitis C screening test for low risk patient - Hepatitis C antibody  4. Prediabetes - Hemoglobin A1c - Lipid panel - CBC with Differential/Platelet - Comprehensive metabolic panel  5. Mixed hyperlipidemia - Lipid panel - CBC with Differential/Platelet - Comprehensive metabolic panel  6. Abdominal aortic aneurysm (AAA) without rupture (HCC) - VAS [place] AAA DUPLEX; Future  7. Need for influenza vaccination - Flu Vaccine QUAD High Dose(Fluad)   Olean Ree, FNP-BC  [facility], [facility]  10/28/2018 1:12 PM

## 2018-10-28 NOTE — Patient Instructions (Addendum)
You tube- Yoga with Adrienne  Increase water intake   Health Maintenance After Age 67 After age 59, you are at a higher risk for certain long-term diseases and infections as well as injuries from falls. Falls are a major cause of broken bones and head injuries in people who are older than age 37. Getting regular preventive care can help to keep you healthy and well. Preventive care includes getting regular testing and making lifestyle changes as recommended by your health care provider. Talk with your health care provider about:  Which screenings and tests you should have. A screening is a test that checks for a disease when you have no symptoms.  A diet and exercise plan that is right for you. What should I know about screenings and tests to prevent falls? Screening and testing are the best ways to find a health problem early. Early diagnosis and treatment give you the best chance of managing medical conditions that are common after age 63. Certain conditions and lifestyle choices may make you more likely to have a fall. Your health care provider may recommend:  Regular vision checks. Poor vision and conditions such as cataracts can make you more likely to have a fall. If you wear glasses, make sure to get your prescription updated if your vision changes.  Medicine review. Work with your health care provider to regularly review all of the medicines you are taking, including over-the-counter medicines. Ask your health care provider about any side effects that may make you more likely to have a fall. Tell your health care provider if any medicines that you take make you feel dizzy or sleepy.  Osteoporosis screening. Osteoporosis is a condition that causes the bones to get weaker. This can make the bones weak and cause them to break more easily.  Blood pressure screening. Blood pressure changes and medicines to control blood pressure can make you feel dizzy.  Strength and balance checks. Your  health care provider may recommend certain tests to check your strength and balance while standing, walking, or changing positions.  Foot health exam. Foot pain and numbness, as well as not wearing proper footwear, can make you more likely to have a fall.  Depression screening. You may be more likely to have a fall if you have a fear of falling, feel emotionally low, or feel unable to do activities that you used to do.  Alcohol use screening. Using too much alcohol can affect your balance and may make you more likely to have a fall. What actions can I take to lower my risk of falls? General instructions  Talk with your health care provider about your risks for falling. Tell your health care provider if: ? You fall. Be sure to tell your health care provider about all falls, even ones that seem minor. ? You feel dizzy, sleepy, or off-balance.  Take over-the-counter and prescription medicines only as told by your health care provider. These include any supplements.  Eat a healthy diet and maintain a healthy weight. A healthy diet includes low-fat dairy products, low-fat (lean) meats, and fiber from whole grains, beans, and lots of fruits and vegetables. Home safety  Remove any tripping hazards, such as rugs, cords, and clutter.  Install safety equipment such as grab bars in bathrooms and safety rails on stairs.  Keep rooms and walkways well-lit. Activity   Follow a regular exercise program to stay fit. This will help you maintain your balance. Ask your health care provider what types of exercise  are appropriate for you.  If you need a cane or walker, use it as recommended by your health care provider.  Wear supportive shoes that have nonskid soles. Lifestyle  Do not drink alcohol if your health care provider tells you not to drink.  If you drink alcohol, limit how much you have: ? 0-1 drink a day for women. ? 0-2 drinks a day for men.  Be aware of how much alcohol is in your  drink. In the [place] one drink equals one typical bottle of beer (12 oz), one-half glass of wine (5 oz), or one shot of hard liquor (1 oz).  Do not use any products that contain nicotine or tobacco, such as cigarettes and e-cigarettes. If you need help quitting, ask your health care provider. Summary  Having a healthy lifestyle and getting preventive care can help to protect your health and wellness after age 43.  Screening and testing are the best way to find a health problem early and help you avoid having a fall. Early diagnosis and treatment give you the best chance for managing medical conditions that are more common for people who are older than age 35.  Falls are a major cause of broken bones and head injuries in people who are older than age 54. Take precautions to prevent a fall at home.  Work with your health care provider to learn what changes you can make to improve your health and wellness and to prevent falls. This information is not intended to replace advice given to you by your health care provider. Make sure you discuss any questions you have with your health care provider. Document Released: 11/04/2016 Document Revised: 04/14/2018 Document Reviewed: 11/04/2016 Elsevier Patient Education  2020 [employer].

## 2018-10-31 LAB — HEPATITIS C ANTIBODY
Hepatitis C Ab: NONREACTIVE
SIGNAL TO CUT-OFF: 0.02 (ref <1.00)

## 2018-11-01 ENCOUNTER — Encounter (INDEPENDENT_AMBULATORY_CARE_PROVIDER_SITE_OTHER): Payer: Self-pay | Admitting: Nurse Practitioner

## 2018-11-01 ENCOUNTER — Ambulatory Visit (INDEPENDENT_AMBULATORY_CARE_PROVIDER_SITE_OTHER): Payer: BC Managed Care – PPO | Admitting: Nurse Practitioner

## 2018-11-01 ENCOUNTER — Other Ambulatory Visit: Payer: Self-pay

## 2018-11-01 ENCOUNTER — Ambulatory Visit (INDEPENDENT_AMBULATORY_CARE_PROVIDER_SITE_OTHER): Payer: BC Managed Care – PPO

## 2018-11-01 VITALS — BP 149/86 | HR 82 | Resp 16 | Wt 181.0 lb

## 2018-11-01 DIAGNOSIS — I714 Abdominal aortic aneurysm, without rupture, unspecified: Secondary | ICD-10-CM

## 2018-11-01 DIAGNOSIS — G8929 Other chronic pain: Secondary | ICD-10-CM | POA: Diagnosis not present

## 2018-11-01 DIAGNOSIS — M545 Low back pain, unspecified: Secondary | ICD-10-CM

## 2018-11-01 NOTE — Progress Notes (Signed)
SUBJECTIVE:  Patient ID: Gregory Obrien, male    DOB: 01/23/1950, 68 y.o.   MRN: [id number] Chief Complaint  Patient presents with  . New Patient (Initial Visit)    ref Corinda GublerLebauer for AAA    HPI  Gregory Obrien is a 68 y.o. male The patient presents to the office for evaluation of an abdominal aortic aneurysm.  The patient has a past medical history of back surgery and back pain.  Patient denies abdominal pain or unusual back pain, no other abdominal complaints.  No history of an acute onset of painful blue discoloration of the toes.     No family history of AAA.   Patient denies amaurosis fugax or TIA symptoms. There is no history of claudication or rest pain symptoms of the lower extremities.  The patient denies angina or shortness of breath.  Abdominal aortic duplex shows an AAA that measures 2.8.  Patient denies nausea, fever, vomiting, and diarrhea.  Aortic diameter measurement is largely unchanged compared to prior exam on September 15, 2017.  The patient also have monophasic waveforms within the proximal right common iliac artery however denies any signs and symptoms of peripheral artery disease such as claudication, numbness or weakness of the lower extremities.  Past Medical History:  Diagnosis Date  . Blood in stool   . History of colon polyps   . UC (ulcerative colitis) (HCC)   . Ulcerative colitis (HCC)    remission since 2017, see Dr. Beatrix FettersNanahari    Past Surgical History:  Procedure Laterality Date  . BACK SURGERY    . COLONOSCOPY WITH PROPOFOL N/A 11/25/2017   Procedure: COLONOSCOPY WITH PROPOFOL;  Surgeon: Toney ReilVanga, Rohini Reddy, MD;  [facility];  Service: Gastroenterology;  Laterality: N/A;  . KNEE SURGERY      Social History   Socioeconomic History  . Marital status: Married    Spouse name: Not on file  . Number of children: Not on file  . Years of education: Not on file  . Highest education level: Not on file  Occupational History  . Not on  file  Social Needs  . Financial resource strain: Not on file  . Food insecurity    Worry: Not on file    Inability: Not on file  . Transportation needs    Medical: Not on file    Non-medical: Not on file  Tobacco Use  . Smoking status: Former Games developermoker  . Smokeless tobacco: Never Used  Substance and Sexual Activity  . Alcohol use: Yes  . Drug use: Never  . Sexual activity: Yes  Lifestyle  . Physical activity    Days per week: Not on file    Minutes per session: Not on file  . Stress: Not on file  Relationships  . Social Musicianconnections    Talks on phone: Not on file    Gets together: Not on file    Attends religious service: Not on file    Active member of club or organization: Not on file    Attends meetings of clubs or organizations: Not on file    Relationship status: Not on file  . Intimate partner violence    Fear of current or ex partner: Not on file    Emotionally abused: Not on file    Physically abused: Not on file    Forced sexual activity: Not on file  Other Topics Concern  . 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 Heart disease Father   . Heart attack Father   . Drug abuse Brother   . Early death Brother     Allergies  Allergen Reactions  . Infliximab Hives    (REMICADE)     Review of Systems   Review of Systems: Negative Unless Checked Constitutional: [] Weight loss  [] Fever  [] Chills Cardiac: [] Chest pain   []  Atrial Fibrillation  [] Palpitations   [] Shortness of breath when laying flat   [] Shortness of breath with exertion. [] Shortness of breath at rest Vascular:  [] Pain in legs with walking   [] Pain in legs with standing [] Pain in legs when laying flat   [] Claudication    [] Pain in feet when laying flat    [] History of DVT   [] Phlebitis   [] Swelling in legs   [] Varicose veins    [] Non-healing ulcers Pulmonary:   [] Uses home oxygen   [] Productive cough   [] Hemoptysis   [] Wheeze  [] COPD   [] Asthma Neurologic:  [] Dizziness   [] Seizures  [] Blackouts [] History of stroke   [] History of TIA  [] Aphasia   [] Temporary Blindness   [] Weakness or numbness in arm   [] Weakness or numbness in leg Musculoskeletal:   [] Joint swelling   [] Joint pain   [x] Low back pain  []  History of Knee Replacement [] Arthritis [x] back Surgeries  []  Spinal Stenosis    Hematologic:  [] Easy bruising  [] Easy bleeding   [] Hypercoagulable state   [] Anemic Gastrointestinal:  [] Diarrhea   [] Vomiting  [] Gastroesophageal reflux/heartburn   [] Difficulty swallowing. [] Abdominal pain Genitourinary:  [] Chronic kidney disease   [] Difficult urination  [] Anuric   [] Blood in urine [] Frequent urination  [] Burning with urination   [] Hematuria Skin:  [] Rashes   [] Ulcers [] Wounds Psychological:  [] History of anxiety   []  History of major depression  []  Memory Difficulties      OBJECTIVE:   Physical Exam  BP (!) 149/86 (BP Location: Right Arm)   Pulse 82   Resp 16   Wt 181 lb (82.1 kg)   BMI 28.78 kg/m   Gen: WD/WN, NAD Head: [place]/AT, No temporalis wasting.  Ear/Nose/Throat: Hearing grossly intact, nares w/o erythema or drainage Eyes: PER, EOMI, sclera nonicteric.  Neck: Supple, no masses.  No JVD.  Pulmonary:  Good air movement, no use of accessory muscles.  Cardiac: RRR Vascular:  Vessel Right Left  Dorsalis Pedis Palpable Palpable  Posterior Tibial Palpable Palpable   Gastrointestinal: soft, non-distended. No guarding/no peritoneal signs.  Musculoskeletal: M/S 5/5 throughout.  No deformity or atrophy.  Neurologic: Pain and light touch intact in extremities.  Symmetrical.  Speech is fluent. Motor exam as listed above. Psychiatric: Judgment intact, Mood & affect appropriate for pt's clinical situation. Dermatologic: No Venous rashes. No Ulcers Noted.  No changes consistent with cellulitis. Lymph : No Cervical  lymphadenopathy, no lichenification or skin changes of chronic lymphedema.       ASSESSMENT AND PLAN:  1. Abdominal aortic aneurysm (AAA) without rupture ([employer]) No surgery or intervention at this time. The patient has an asymptomatic abdominal aortic aneurysm that is less than 4 cm in maximal diameter.  I have discussed the natural history of abdominal aortic aneurysm and the small risk of rupture for aneurysm less than 5 cm in size.  However, as these small aneurysms tend to enlarge over time, continued surveillance with ultrasound or CT scan is mandatory.  I  have also discussed optimizing medical management with hypertension and lipid control and the importance of abstinence from tobacco.  The patient is also encouraged to exercise a minimum of 30 minutes 4 times a week.  Should the patient develop new onset abdominal or back pain or signs of peripheral embolization they are instructed to seek medical attention immediately and to alert the physician providing care that they have an aneurysm.  The patient voices their understanding. Due to the fact that the patient has a mildly ectatic abdominal aorta aneurysm that has not changed within the last year, it is reasonable to see the patient back in 18 months to repeat the aortic duplex.     2. Chronic low back pain, unspecified back pain laterality, unspecified whether sciatica present Continue NSAID medications as already ordered, these medications have been reviewed and there are no changes at this time.  Continued activity and therapy was stressed.    Current Outpatient Medications on File Prior to Visit  Medication Sig Dispense Refill  . pravastatin (PRAVACHOL) 40 MG tablet Take 1 tablet (40 mg total) by mouth daily. 90 tablet 0   No current facility-administered medications on file prior to visit.     There are no Patient Instructions on file for this visit. No follow-ups on file.   Georgiana Spinner, NP  This note was completed  with Office manager.  Any errors are purely unintentional.

## 2018-11-02 ENCOUNTER — Other Ambulatory Visit: Payer: BLUE CROSS/BLUE SHIELD

## 2018-11-17 ENCOUNTER — Telehealth: Payer: Self-pay

## 2018-11-17 DIAGNOSIS — Z20828 Contact with and (suspected) exposure to other viral communicable diseases: Secondary | ICD-10-CM | POA: Diagnosis not present

## 2018-11-17 NOTE — Telephone Encounter (Signed)
Patient contacted our office and stated that Gregory Obrien had mentioned changing his statin medication based on his previous cholesterol lab results - and he states he is okay with this to try and get his cholesterol levels under control.  Will route to Debbie to see what new medication patient needs. Thanks!

## 2018-11-18 ENCOUNTER — Encounter: Payer: Self-pay | Admitting: Family Medicine

## 2018-11-18 ENCOUNTER — Other Ambulatory Visit: Payer: Self-pay | Admitting: Family Medicine

## 2018-11-18 DIAGNOSIS — E782 Mixed hyperlipidemia: Secondary | ICD-10-CM

## 2018-11-18 MED ORDER — ROSUVASTATIN CALCIUM 20 MG PO TABS: 20.0000 mg | ORAL_TABLET | Freq: Every day | ORAL | 1 refills | Status: DC

## 2018-11-18 NOTE — Telephone Encounter (Signed)
Order for rosuvastatin placed. Mychart message sent to patient.

## 2019-01-24 DIAGNOSIS — Z20828 Contact with and (suspected) exposure to other viral communicable diseases: Secondary | ICD-10-CM | POA: Diagnosis not present

## 2019-03-09 ENCOUNTER — Other Ambulatory Visit: Payer: Self-pay | Admitting: Urology

## 2019-03-15 ENCOUNTER — Other Ambulatory Visit: Payer: Self-pay | Admitting: Urology

## 2019-03-22 ENCOUNTER — Encounter: Payer: Self-pay | Admitting: Urology

## 2019-03-22 ENCOUNTER — Ambulatory Visit (INDEPENDENT_AMBULATORY_CARE_PROVIDER_SITE_OTHER): Payer: BLUE CROSS/BLUE SHIELD | Admitting: Urology

## 2019-03-22 ENCOUNTER — Other Ambulatory Visit: Payer: Self-pay

## 2019-03-22 VITALS — BP 159/89 | HR 78 | Ht 66.0 in | Wt 180.0 lb

## 2019-03-22 DIAGNOSIS — L729 Follicular cyst of the skin and subcutaneous tissue, unspecified: Secondary | ICD-10-CM | POA: Diagnosis not present

## 2019-03-22 DIAGNOSIS — N5201 Erectile dysfunction due to arterial insufficiency: Secondary | ICD-10-CM | POA: Diagnosis not present

## 2019-03-22 NOTE — Progress Notes (Signed)
   03/22/2019 12:45 PM   Bell Cai Feb 10, 1950 [id number]  Referring provider: Emi Belfast, FNP [address] [place],  [place] [address]  Chief Complaint  Patient presents with  . Medication Refill    HPI: 69 y.o. male previously seen for ED. the presents today for medication refill and evaluation of a "lump" in his scrotum.  -Noticed 2 weeks ago -No drainage -Mild discomfort at times -Sildenafil effective for ED at 40 mg   PMH: Past Medical History:  Diagnosis Date  . Blood in stool   . History of colon polyps   . UC (ulcerative colitis) (HCC)   . Ulcerative colitis (HCC)    remission since 2017, see Dr. Beatrix Fetters    Surgical History: Past Surgical History:  Procedure Laterality Date  . BACK SURGERY    . COLONOSCOPY WITH PROPOFOL N/A 11/25/2017   Procedure: COLONOSCOPY WITH PROPOFOL;  Surgeon: Toney Reil, MD;  Location: [facility] ENDOSCOPY;  Service: Gastroenterology;  Laterality: N/A;  . KNEE SURGERY      Home Medications:  Allergies as of 03/22/2019      Reactions   Infliximab Hives   (REMICADE)      Medication List       Accurate as of March 22, 2019 12:45 PM. If you have any questions, ask your nurse or doctor.        rosuvastatin 20 MG tablet Commonly known as: CRESTOR Take 1 tablet (20 mg total) by mouth daily.       Allergies:  Allergies  Allergen Reactions  . Infliximab Hives    (REMICADE)    Family History: Family History  Problem Relation Age of Onset  . Heart disease Father   . Heart attack Father   . Drug abuse Brother   . Early death Brother     Social History:  reports that he has quit smoking. He has never used smokeless tobacco. He reports current alcohol use. He reports that he does not use drugs.   Physical Exam: BP (!) 159/89   Pulse 78   Ht 5\' 6"  (1.676 m)   Wt 180 lb (81.6 kg)   BMI 29.05 kg/m   Constitutional:  Alert and oriented, No acute distress. HEENT: [place] AT, moist mucus membranes.   Trachea midline, no masses. Cardiovascular: No clubbing, cyanosis, or edema. Respiratory: Normal respiratory effort, no increased work of breathing. GI: Abdomen is soft, nontender, nondistended, no abdominal masses GU: Testes descended bilaterally without masses or tenderness.  On the posterior right hemiscrotum is a BB sized smooth, mobile mass.  Punctate opening noted. Skin: As above   Assessment & Plan:    - Scrotal inclusion cyst Small and minimally symptomatic.  Options of surveillance versus excision were discussed.  He would like to observe the area for the next few weeks and if worsening pain or increased size may desire excision.  - Erectile dysfunction Sildenafil refilled   , MD  [facility] [address] [place], [place] [phone]

## 2019-03-24 ENCOUNTER — Other Ambulatory Visit: Payer: Self-pay | Admitting: *Deleted

## 2019-03-24 ENCOUNTER — Telehealth: Payer: Self-pay | Admitting: Urology

## 2019-03-24 MED ORDER — SILDENAFIL CITRATE 20 MG PO TABS: ORAL_TABLET | ORAL | 2 refills | Status: DC

## 2019-03-24 NOTE — Telephone Encounter (Signed)
Patient called the office today.  He was seen in the office on Wednesday with Dr. Lonna Cobb.  In the note, it says that Sildenfil was prescribed.  Please send a prescription to Sildenafil to the [facility] pharmacy.

## 2019-05-08 ENCOUNTER — Encounter: Payer: Self-pay | Admitting: Family Medicine

## 2019-05-08 ENCOUNTER — Other Ambulatory Visit: Payer: Self-pay

## 2019-05-08 ENCOUNTER — Ambulatory Visit: Payer: BC Managed Care – PPO | Admitting: Family Medicine

## 2019-05-08 VITALS — BP 134/76 | HR 59 | Temp 98.6°F | Ht 66.0 in | Wt 183.4 lb

## 2019-05-08 DIAGNOSIS — R0781 Pleurodynia: Secondary | ICD-10-CM | POA: Diagnosis not present

## 2019-05-08 DIAGNOSIS — R944 Abnormal results of kidney function studies: Secondary | ICD-10-CM | POA: Diagnosis not present

## 2019-05-08 LAB — BASIC METABOLIC PANEL
BUN: 15 mg/dL (ref 6–23)
CO2: 29 mEq/L (ref 19–32)
Calcium: 9.5 mg/dL (ref 8.4–10.5)
Chloride: 103 mEq/L (ref 96–112)
Creatinine, Ser: 1.21 mg/dL (ref 0.40–1.50)
GFR: 59.73 mL/min — ABNORMAL LOW (ref >60.00)
Glucose, Bld: 99 mg/dL (ref 70–99)
Potassium: 4 mEq/L (ref 3.5–5.1)
Sodium: 139 mEq/L (ref 135–145)

## 2019-05-08 MED ORDER — CYCLOBENZAPRINE HCL 10 MG PO TABS: 10.0000 mg | ORAL_TABLET | Freq: Every evening | ORAL | 0 refills | Status: DC | PRN
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
## 2019-05-08 NOTE — Patient Instructions (Signed)
Rib Contusion A rib contusion is a deep bruise on your rib area. Contusions are the result of a blunt trauma that causes bleeding and injury to the tissues under the skin. A rib contusion may involve bruising of the ribs and of the skin and muscles in the area. The skin over the contusion may turn blue, purple, or yellow. Minor injuries will give you a painless contusion. More severe contusions may stay painful and swollen for a few weeks. What are the causes? This condition is usually caused by a blow, trauma, or direct force to an area of the body. This often occurs while playing contact sports. What are the signs or symptoms? Symptoms of this condition include:  Swelling and redness of the injured area.  Discoloration of the injured area.  Tenderness and soreness of the injured area.  Pain with or without movement. How is this diagnosed? This condition may be diagnosed based on:  Your symptoms and medical history.  A physical exam.  Imaging tests--such as an X-ray, CT scan, or MRI--to determine if there were internal injuries or broken bones (fractures). How is this treated? This condition may be treated with:  Rest. This is often the best treatment for a rib contusion.  Icing. This reduces swelling and inflammation.  Deep-breathing exercises. These may be recommended to reduce the risk for lung collapse and pneumonia.  Medicines. Over-the-counter or prescription medicines may be given to control pain.  Injection of a numbing medicine around the nerve near your injury (nerve block). Follow these instructions at home:     Medicines  Take over-the-counter and prescription medicines only as told by your health care provider.  Do not drive or use heavy machinery while taking prescription pain medicine.  If you are taking prescription pain medicine, take actions to prevent or treat constipation. Your health care provider may recommend that you: ? Drink enough fluid to keep  your urine pale yellow. ? Eat foods that are high in fiber, such as fresh fruits and vegetables, whole grains, and beans. ? Limit foods that are high in fat and processed sugars, such as fried or sweet foods. ? Take an over-the-counter or prescription medicine for constipation. Managing pain, stiffness, and swelling  If directed, put ice on the injured area: ? Put ice in a plastic bag. ? Place a towel between your skin and the bag. ? Leave the ice on for 20 minutes, 2-3 times a day.  Rest the injured area. Avoid strenuous activity and any activities or movements that cause pain. Be careful during activities and avoid bumping the injured area.  Do not lift anything that is heavier than 5 lb (2.3 kg), or the limit that you are told, until your health care provider says that it is safe. General instructions  Do not use any products that contain nicotine or tobacco, such as cigarettes and e-cigarettes. These can delay healing. If you need help quitting, ask your health care provider.  Do deep-breathing exercises as told by your health care provider.  If you were given an incentive spirometer, use it every 1-2 hours while you are awake, or as recommended by your health care provider. This device measures how well you are filling your lungs with each breath.  Keep all follow-up visits as told by your health care provider. This is important. Contact a health care provider if you have:  Increased bruising or swelling.  Pain that is not controlled with treatment.  A fever. Get help right away if   you:  Have difficulty breathing or shortness of breath.  Develop a continual cough or you cough up thick or bloody sputum.  Feel nauseous or you vomit.  Have pain in your abdomen. Summary  A rib contusion is a deep bruise on your rib area. Contusions are the result of a blunt trauma that causes bleeding and injury to the tissues under the skin.  The skin overlying the contusion may turn  blue, purple, or yellow. Minor injuries may give you a painless contusion. More severe contusions may stay painful and swollen for a few weeks.  Rest the injured area. Avoid strenuous activity and any activities or movements that cause pain. This information is not intended to replace advice given to you by your health care provider. Make sure you discuss any questions you have with your health care provider. Document Revised: 01/20/2017 Document Reviewed: 01/20/2017 Elsevier Patient Education  2020 [employer]  

## 2019-05-10 ENCOUNTER — Other Ambulatory Visit: Payer: Self-pay | Admitting: Family Medicine

## 2019-05-10 DIAGNOSIS — E782 Mixed hyperlipidemia: Secondary | ICD-10-CM

## 2019-08-14 ENCOUNTER — Other Ambulatory Visit: Payer: Self-pay

## 2019-08-14 ENCOUNTER — Encounter: Payer: Self-pay | Admitting: Family Medicine

## 2019-08-14 ENCOUNTER — Ambulatory Visit: Payer: BC Managed Care – PPO | Admitting: Family Medicine

## 2019-08-14 VITALS — BP 122/76 | HR 74 | Temp 97.6°F | Wt 183.0 lb

## 2019-08-14 DIAGNOSIS — R21 Rash and other nonspecific skin eruption: Secondary | ICD-10-CM | POA: Diagnosis not present

## 2019-08-14 DIAGNOSIS — K30 Functional dyspepsia: Secondary | ICD-10-CM

## 2019-08-14 NOTE — Progress Notes (Signed)
   Subjective:    Patient ID: Gregory Obrien, male    DOB: 04/06/1950, 69 y.o.   MRN: [id number]  HPI Chief Complaint  Patient presents with  . new problem    Pt stated--hands/head/--itching, swollen, redness and acid reflux --1 week     This is a 69 yo male who presents today with above cc.  Has noticed itching and redness on hands x 5-6 days. Spots on thighs, back, scalp. No new soaps, lotions, shampoos, foods. No new muscle or joint pain.  Taking Benadryl every 8 hours with little relief, also takes one half of Zyrtec daily.  Hands are puffy  New acid indigestion around the same time. Temporary relief with Tums. Usually in am before eating. No change in bowel habits.     Review of Systems Per HPI    Objective:   Physical Exam Vitals reviewed.  Constitutional:      General: He is not in acute distress.    Appearance: Normal appearance. He is normal weight. He is not ill-appearing, toxic-appearing or diaphoretic.  HENT:     Head: Normocephalic and atraumatic.  Eyes:     Conjunctiva/sclera: Conjunctivae normal.  Cardiovascular:     Rate and Rhythm: Normal rate.  Pulmonary:     Effort: Pulmonary effort is normal.  Musculoskeletal:     Cervical back: Normal range of motion and neck supple.  Skin:    General: Skin is warm and dry.     Comments: Bilateral palmar surfaces with some puffiness and patchy erythema.  Scattered welts on trunk, posterior scalp.  Neurological:     Mental Status: He is alert and oriented to person, place, and time.  Psychiatric:        Mood and Affect: Mood normal.        Behavior: Behavior normal.        Thought Content: Thought content normal.        Judgment: Judgment normal.       BP 122/76   Pulse 74   Temp 97.6 F (36.4 C)   Wt 183 lb (83 kg)   SpO2 94%   BMI 29.54 kg/m      Wt Readings from Last 3 Encounters:  08/14/19 183 lb (83 kg)  05/08/19 183 lb 6.4 oz (83.2 kg)  03/22/19 180 lb (81.6 kg)    Assessment & Plan:  1.  Rash -Unclear etiology, suspect some type of hive -We will have him increase his Zyrtec to whole tablet, add over-the-counter Tagamet twice daily, avoid overheating -He is to let me know if no improvement in a couple of days and we will refer him to dermatology  2. Acid indigestion -Over-the-counter Tagamet twice daily to also treat rash, avoid any identified triggers  This visit occurred during the SARS-CoV-2 public health emergency.  Safety protocols were in place, including screening questions prior to the visit, additional usage of staff PPE, and extensive cleaning of exam room while observing appropriate contact time as indicated for disinfecting solutions.    Olean Ree, FNP-BC  [facility], [facility]  08/15/2019 8:27 AM

## 2019-08-14 NOTE — Patient Instructions (Signed)
Increase Zyrtec to whole tablet in am for 10-14 days  Get Tagamet over the counter and take twice a day for 10 days  Let me know if no improvement in a couple of days  Hives Hives are itchy, red, swollen areas on your skin. Hives can show up on any part of your body. Hives often fade within 24 hours (acute hives). New hives can show up after old ones fade. This can go on for many days or weeks (chronic hives). Hives do not spread from person to person (are not contagious). Hives are caused by your body's response to something that you are allergic to (allergen). These are sometimes called triggers. You can get hives right after being around a trigger, or hours later. What are the causes?  Allergies to foods.  Insect bites or stings.  Pollen.  Pets.  Latex.  Chemicals.  Spending time in sunlight, heat, or cold.  Exercise.  Stress.  Some medicines.  Viruses. This includes the common cold.  Infections caused by germs (bacteria).  Allergy shots.  Blood transfusions. Sometimes, the cause is not known. What increases the risk?  Being a woman.  Being allergic to foods such as: ? Citrus fruits. ? Milk. ? Eggs. ? Peanuts. ? Tree nuts. ? Shellfish.  Being allergic to: ? Medicines. ? Latex. ? Insects. ? Animals. ? Pollen. What are the signs or symptoms?   Raised, itchy, red or white bumps or patches on your skin. These areas may: ? Get large and swollen. ? Change in shape and location. ? Stand alone or connect to each other over a large area of skin. ? Sting or hurt. ? Turn white when pressed in the center (blanch). In very bad cases, your hands, feet, and face may also get swollen. This may happen if hives start deeper in your skin. How is this treated? Treatment for this condition depends on your symptoms. Treatment may include:  Using cool, wet cloths (cool compresses) or taking cool showers to stop the itching.  Medicines that help: ? Relieve itching  (antihistamines). ? Reduce swelling (corticosteroids). ? Treat infection (antibiotics).  A medicine (omalizumab) that is given as a shot (injection). Your doctor may prescribe this if you have hives that do not get better even after other treatments.  In very bad cases, you may need a shot of a medicine called epinephrineto prevent a life-threatening allergic reaction (anaphylaxis). Follow these instructions at home: Medicines  Take or apply over-the-counter and prescription medicines only as told by your doctor.  If you were prescribed an antibiotic medicine, use it as told by your doctor. Do not stop using it even if you start to feel better. Skin care  Apply cool, wet cloths to the hives.  Do not scratch your skin. Do not rub your skin. General instructions  Do not take hot showers or baths. This can make itching worse.  Do not wear tight clothes.  Use sunscreen and wear clothes that cover your skin when you are outside.  Avoid any triggers that cause your hives. Keep a journal to help track what causes your hives. Write down: ? What medicines you take. ? What you eat and drink. ? What products you use on your skin.  Keep all follow-up visits as told by your doctor. This is important. Contact a doctor if:  Your symptoms are not better with medicine.  Your joints hurt or are swollen. Get help right away if:  You have a fever.  You have  pain in your belly (abdomen).  Your tongue or lips are swollen.  Your eyelids are swollen.  Your chest or throat feels tight.  You have trouble breathing or swallowing. These symptoms may be an emergency. Do not wait to see if the symptoms will go away. Get medical help right away. Call your local emergency services (911 in the [place] Do not drive yourself to the hospital. Summary  Hives are itchy, red, swollen areas on your skin.  Treatment for this condition depends on your symptoms.  Avoid things that cause your hives. Keep  a journal to help track what causes your hives.  Take and apply over-the-counter and prescription medicines only as told by your doctor.  Keep all follow-up visits as told by your doctor. This is important. This information is not intended to replace advice given to you by your health care provider. Make sure you discuss any questions you have with your health care provider. Document Revised: 07/07/2017 Document Reviewed: 07/07/2017 Elsevier Patient Education  2020 [employer].

## 2019-09-21 ENCOUNTER — Other Ambulatory Visit: Payer: Self-pay | Admitting: Family Medicine

## 2019-09-21 DIAGNOSIS — E782 Mixed hyperlipidemia: Secondary | ICD-10-CM

## 2020-02-06 ENCOUNTER — Ambulatory Visit: Payer: BC Managed Care – PPO | Admitting: Family Medicine

## 2020-02-06 ENCOUNTER — Other Ambulatory Visit: Payer: Self-pay

## 2020-02-06 ENCOUNTER — Encounter: Payer: Self-pay | Admitting: Family Medicine

## 2020-02-06 VITALS — BP 124/78 | HR 69 | Temp 97.4°F | Ht 66.0 in | Wt 181.3 lb

## 2020-02-06 DIAGNOSIS — E7800 Pure hypercholesterolemia, unspecified: Secondary | ICD-10-CM | POA: Diagnosis not present

## 2020-02-06 DIAGNOSIS — Z23 Encounter for immunization: Secondary | ICD-10-CM | POA: Diagnosis not present

## 2020-02-06 DIAGNOSIS — E782 Mixed hyperlipidemia: Secondary | ICD-10-CM | POA: Diagnosis not present

## 2020-02-06 DIAGNOSIS — E785 Hyperlipidemia, unspecified: Secondary | ICD-10-CM | POA: Insufficient documentation

## 2020-02-06 DIAGNOSIS — K5150 Left sided colitis without complications: Secondary | ICD-10-CM | POA: Diagnosis not present

## 2020-02-06 DIAGNOSIS — R7303 Prediabetes: Secondary | ICD-10-CM | POA: Diagnosis not present

## 2020-02-06 DIAGNOSIS — I714 Abdominal aortic aneurysm, without rupture, unspecified: Secondary | ICD-10-CM

## 2020-02-06 DIAGNOSIS — R42 Dizziness and giddiness: Secondary | ICD-10-CM

## 2020-02-06 MED ORDER — ROSUVASTATIN CALCIUM 20 MG PO TABS: 20.0000 mg | ORAL_TABLET | Freq: Every day | ORAL | 3 refills | Status: DC

## 2020-02-06 NOTE — Assessment & Plan Note (Signed)
Occasional  Helped by zyrtec 5 mg daily to control ETD

## 2020-02-06 NOTE — Assessment & Plan Note (Signed)
In remission  Doing well

## 2020-02-06 NOTE — Assessment & Plan Note (Signed)
Asymptomatic  2.8 cm  Watched by vascular  Working to control cholesterol

## 2020-02-06 NOTE — Assessment & Plan Note (Signed)
A1C today  disc imp of low glycemic diet and wt loss to prevent DM2  

## 2020-02-06 NOTE — Progress Notes (Signed)
Subjective:    Patient ID: Gregory Obrien, male    DOB: [birth year], 70 y.o.   MRN: [id number]  This visit occurred during the SARS-CoV-2 public health emergency.  Safety protocols were in place, including screening questions prior to the visit, additional usage of staff PPE, and extensive cleaning of exam room while observing appropriate contact time as indicated for disinfecting solutions.    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    He has a history of ulcerative colitis  In remission for 3 years /no treatment now   H/o ED Takes  sildenafil from urology  Lab Results  Component Value Date   PSA 0.28 10/28/2018   PSA 0.41 03/29/2017   No prostate problems    AAA measuring 2.8 cm (sees vascular)   Takes zyrtec 5 mg daily for allergies/ear fluid and vertigo   BP Readings from Last 3 Encounters:  02/06/20 124/78  08/14/19 122/76  05/08/19 134/76   Pulse Readings from Last 3 Encounters:  02/06/20 69  08/14/19 74  05/08/19 (!) 59     Hyperlipidemia Lab Results  Component Value Date   CHOL 286 (H) 10/28/2018   CHOL 217 (H) 09/24/2017   CHOL 251 (H) 04/05/2017   Lab Results  Component Value Date   HDL 48.20 10/28/2018   HDL 36.70 (L) 09/24/2017   HDL 43.60 04/05/2017   Lab Results  Component Value Date   LDLCALC 204 (H) 10/28/2018   LDLCALC 144 (H) 09/24/2017   LDLCALC 186 (H) 04/05/2017   Lab Results  Component Value Date   TRIG 171.0 (H) 10/28/2018   TRIG 183.0 (H) 09/24/2017   TRIG 104.0 04/05/2017   Lab Results  Component Value Date   CHOLHDL 6 10/28/2018   CHOLHDL 6 09/24/2017   CHOLHDL 6 04/05/2017   No results  found for: LDLDIRECT Loves to eat steaks  Does watch his diet    crestor 10 mg daily-has not had f/u labs since starting    Prediabetes Lab Results  Component Value Date   HGBA1C 6.1 10/28/2018   Patient Active Problem List   Diagnosis Date Noted  . Prediabetes 02/06/2020  . Hyperlipidemia 02/06/2020  . Scrotal cyst 03/22/2019  . Erectile dysfunction due to arterial insufficiency 03/22/2019  . Trochanteric bursitis of left hip 11/23/2017  . AAA (abdominal aortic aneurysm) (HCC) 10/07/2017  . Vertigo 07/07/2017  . Left sided colitis without complications (HCC)   . Low back pain 07/22/2016  . Low back strain 07/22/2016   Past Medical History:  Diagnosis Date  . Blood in stool   . History of colon polyps   . UC (ulcerative colitis) (HCC)   . Ulcerative colitis (HCC)    remission since 2017, see Dr. Beatrix Fetters   Past Surgical History:  Procedure Laterality Date  . BACK SURGERY    . COLONOSCOPY WITH PROPOFOL N/A 11/25/2017   Procedure: COLONOSCOPY WITH PROPOFOL;  Surgeon: Toney Reil, MD;  Location: [facility] ENDOSCOPY;  Service: Gastroenterology;  Laterality: N/A;  . KNEE SURGERY     Social History   Tobacco Use  . Smoking status: Former  Smoker  . Smokeless tobacco: Never Used  Vaping Use  . Vaping Use: Never used  Substance Use Topics  . Alcohol use: Yes  . Drug use: Never   Family History  Problem Relation Age of Onset  . Heart disease Father   . Heart attack Father   . Drug abuse Brother   . Early death Brother    Allergies  Allergen Reactions  . Infliximab Hives    (REMICADE)   Current Outpatient Medications on File Prior to Visit  Medication Sig Dispense Refill  . sildenafil (REVATIO) 20 MG tablet 2-5 tabs 1 hour prior to intercourse 90 tablet 2   No current facility-administered medications on file prior to visit.    Review of Systems Review of Systems  Constitutional: Negative for chills, fever and malaise/fatigue.  HENT: Negative for  congestion, ear pain, sinus pain and sore throat.   Eyes: Negative for blurred vision, discharge and redness.  Respiratory: Negative for cough, shortness of breath and stridor.   Cardiovascular: Negative for chest pain, palpitations and leg swelling.  Gastrointestinal: Negative for abdominal pain, diarrhea, nausea and vomiting.  Musculoskeletal: Negative for myalgias.  Skin: Negative for rash.  Neurological: Negative for dizziness and headaches.       Objective:     Physical Exam Constitutional:      Appearance: Normal appearance. He is normal weight. He is not ill-appearing.  Eyes:     General: No scleral icterus.    Conjunctiva/sclera: Conjunctivae normal.     Pupils: Pupils are equal, round, and reactive to light.  Cardiovascular:     Rate and Rhythm: Normal rate and regular rhythm.     Pulses: Normal pulses.     Heart sounds: Normal heart sounds.  Pulmonary:     Effort: Pulmonary effort is normal. No respiratory distress.     Breath sounds: Normal breath sounds. No wheezing or rales.  Abdominal:     General: Abdomen is flat. Bowel sounds are normal. There is no distension.     Palpations: Abdomen is soft. There is no mass or pulsatile mass.  Musculoskeletal:     Cervical back: Normal range of motion and neck supple.     Right lower leg: No edema.     Left lower leg: No edema.  Lymphadenopathy:     Cervical: No cervical adenopathy.  Skin:    General: Skin is warm and dry.     Coloration: Skin is not pale.     Findings: No erythema or rash.     Comments: Solar lentigines diffusely   Neurological:     Mental Status: He is alert.     Coordination: Coordination normal.     Deep Tendon Reflexes: Reflexes normal.  Psychiatric:        Mood and Affect: Mood normal.        Assessment & Plan:   Problem List Items Addressed This Visit      Cardiovascular and Mediastinum   AAA (abdominal aortic aneurysm) (HCC)    Asymptomatic  2.8 cm  Watched by vascular  Working  to control cholesterol       Relevant Medications   rosuvastatin (CRESTOR) 20 MG tablet     Digestive   Left sided colitis without complications (HCC)    In remission  Doing well        Other   Vertigo    Occasional  Helped by zyrtec 5 mg daily to control ETD      Prediabetes  A1C today  disc imp of low glycemic diet and wt loss to prevent DM2       Relevant Orders   Hemoglobin A1c   Hyperlipidemia - Primary    Disc goals for lipids and reasons to control them Rev last labs with pt Rev low sat fat diet in detail Now on rosuvastatin 20 mg and I expect improvement  Labs today       Relevant Medications   rosuvastatin (CRESTOR) 20 MG tablet   Other Relevant Orders   Comprehensive metabolic panel   Lipid panel    Other Visit Diagnoses    Need for vaccination with 13-polyvalent pneumococcal conjugate vaccine       Relevant Orders   Pneumococcal conjugate vaccine 13-valent (Completed)

## 2020-02-06 NOTE — Assessment & Plan Note (Signed)
Disc goals for lipids and reasons to control them Rev last labs with pt Rev low sat fat diet in detail Now on rosuvastatin 20 mg and I expect improvement  Labs today

## 2020-02-06 NOTE — Patient Instructions (Addendum)
For cholesterol  Avoid red meat/ fried foods/ egg yolks/ fatty breakfast meats/ butter, cheese and high fat dairy/ and shellfish    Think about a regular exercise program home or outdoors   Labs today   prevnar vaccine today   Take care of yourself

## 2020-02-07 ENCOUNTER — Encounter: Payer: Self-pay | Admitting: Family Medicine

## 2020-02-07 LAB — COMPREHENSIVE METABOLIC PANEL
ALT: 18 U/L (ref 0–53)
AST: 22 U/L (ref 0–37)
Albumin: 4.7 g/dL (ref 3.5–5.2)
Alkaline Phosphatase: 49 U/L (ref 39–117)
BUN: 18 mg/dL (ref 6–23)
CO2: 26 mEq/L (ref 19–32)
Calcium: 9.7 mg/dL (ref 8.4–10.5)
Chloride: 104 mEq/L (ref 96–112)
Creatinine, Ser: 1.18 mg/dL (ref 0.40–1.50)
GFR: 63.53 mL/min (ref >60.00)
Glucose, Bld: 92 mg/dL (ref 70–99)
Potassium: 3.9 mEq/L (ref 3.5–5.1)
Sodium: 138 mEq/L (ref 135–145)
Total Bilirubin: 0.4 mg/dL (ref 0.2–1.2)
Total Protein: 7.7 g/dL (ref 6.0–8.3)

## 2020-02-07 LAB — LIPID PANEL
Cholesterol: 201 mg/dL — ABNORMAL HIGH (ref 0–200)
HDL: 42.3 mg/dL (ref >39.00)
LDL Cholesterol: 125 mg/dL — ABNORMAL HIGH (ref 0–99)
NonHDL: 159.02
Total CHOL/HDL Ratio: 5
Triglycerides: 168 mg/dL — ABNORMAL HIGH (ref 0.0–149.0)
VLDL: 33.6 mg/dL (ref 0.0–40.0)

## 2020-02-07 LAB — HEMOGLOBIN A1C: Hgb A1c MFr Bld: 6.2 % (ref 4.6–6.5)

## 2020-02-08 ENCOUNTER — Encounter: Payer: Self-pay | Admitting: *Deleted

## 2020-05-03 ENCOUNTER — Other Ambulatory Visit: Payer: Self-pay

## 2020-05-03 ENCOUNTER — Ambulatory Visit (INDEPENDENT_AMBULATORY_CARE_PROVIDER_SITE_OTHER): Payer: BC Managed Care – PPO | Admitting: Vascular Surgery

## 2020-05-03 ENCOUNTER — Encounter (INDEPENDENT_AMBULATORY_CARE_PROVIDER_SITE_OTHER): Payer: Self-pay | Admitting: Vascular Surgery

## 2020-05-03 ENCOUNTER — Ambulatory Visit (INDEPENDENT_AMBULATORY_CARE_PROVIDER_SITE_OTHER): Payer: BC Managed Care – PPO

## 2020-05-03 VITALS — BP 137/83 | HR 61 | Resp 16 | Wt 175.8 lb

## 2020-05-03 DIAGNOSIS — I714 Abdominal aortic aneurysm, without rupture, unspecified: Secondary | ICD-10-CM

## 2020-05-03 DIAGNOSIS — E7800 Pure hypercholesterolemia, unspecified: Secondary | ICD-10-CM

## 2020-05-03 NOTE — Progress Notes (Signed)
MRN : [id number]  Gregory Obrien is a 70 y.o. ([birth year]) male who presents with chief complaint of  Chief Complaint  Patient presents with  . AAA    18 month follow up  .  History of Present Illness: Patient returns today in follow up of his abdominal aortic aneurysm.  He is doing well today.  He has no complaints relatable to his aneurysm.  No lower abdominal or back pain.  No signs of peripheral embolization.  His AAA duplex today reveals a maximal aortic sac size of 3.15 cm.  This is slightly increased from his study in 2020.  Current Outpatient Medications  Medication Sig Dispense Refill  . cetirizine (ZYRTEC) 5 MG tablet Take 5 mg by mouth daily.    . rosuvastatin (CRESTOR) 20 MG tablet Take 1 tablet (20 mg total) by mouth daily. 90 tablet 3  . sildenafil (REVATIO) 20 MG tablet 2-5 tabs 1 hour prior to intercourse 90 tablet 2   No current facility-administered medications for this visit.    Past Medical History:  Diagnosis Date  . Blood in stool   . History of colon polyps   . UC (ulcerative colitis) (HCC)   . Ulcerative colitis (HCC)    remission since 2017, see Dr. Beatrix Fetters    Past Surgical History:  Procedure Laterality Date  . BACK SURGERY    . COLONOSCOPY WITH PROPOFOL N/A 11/25/2017   Procedure: COLONOSCOPY WITH PROPOFOL;  Surgeon: Toney Reil, MD;  Location[facility] ENDOSCOPY;  Service: Gastroenterology;  Laterality: N/A;  . KNEE SURGERY       Social History   Tobacco Use  . Smoking status: Former Games developer  . Smokeless tobacco: Never Used  Vaping Use  . Vaping Use: Never used  Substance Use Topics  . Alcohol use: Yes  . Drug use: Never     Family History  Problem Relation Age of Onset  . Heart disease Father   . Heart attack Father   . Drug abuse Brother   . Early death Brother     Allergies  Allergen Reactions  . Infliximab Hives    (REMICADE)     REVIEW OF SYSTEMS (Negative unless checked)  Constitutional: [] Weight loss   [] Fever  [] Chills Cardiac: [] Chest pain   [] Chest pressure   [] Palpitations   [] Shortness of breath when laying flat   [] Shortness of breath at rest   [] Shortness of breath with exertion. Vascular:  [] Pain in legs with walking   [] Pain in legs at rest   [] Pain in legs when laying flat   [] Claudication   [] Pain in feet when walking  [] Pain in feet at rest  [] Pain in feet when laying flat   [] History of DVT   [] Phlebitis   [] Swelling in legs   [] Varicose veins   [] Non-healing ulcers Pulmonary:   [] Uses home oxygen   [] Productive cough   [] Hemoptysis   [] Wheeze  [] COPD   [] Asthma Neurologic:  [] Dizziness  [] Blackouts   [] Seizures   [] History of stroke   [] History of TIA  [] Aphasia   [] Temporary blindness   [] Dysphagia   [] Weakness or numbness in arms   [] Weakness or numbness in legs Musculoskeletal:  [] Arthritis   [] Joint swelling   [] Joint pain   [] Low back pain Hematologic:  [] Easy bruising  [] Easy bleeding   [] Hypercoagulable state   [] Anemic   Gastrointestinal:  [x] Blood in stool   [] Vomiting blood  [x] Gastroesophageal reflux/heartburn   [] Abdominal pain Genitourinary:  [] Chronic kidney disease   []   Difficult urination  [] Frequent urination  [] Burning with urination   [] Hematuria Skin:  [] Rashes   [] Ulcers   [] Wounds Psychological:  [] History of anxiety   []  History of major depression.  Physical Examination  BP 137/83 (BP Location: Right Arm)   Pulse 61   Resp 16   Wt 175 lb 12.8 oz (79.7 kg)   BMI 28.37 kg/m  Gen:  WD/WN, NAD Head: [place]/AT, No temporalis wasting. Ear/Nose/Throat: Hearing grossly intact, nares w/o erythema or drainage Eyes: Conjunctiva clear. Sclera non-icteric Neck: Supple.  Trachea midline Pulmonary:  Good air movement, no use of accessory muscles.  Cardiac: RRR, no JVD Vascular:  Vessel Right Left  Radial Palpable Palpable                          PT Palpable Palpable  DP Palpable Palpable   Gastrointestinal: soft, non-tender/non-distended. No  guarding/reflex.  Musculoskeletal: M/S 5/5 throughout.  No deformity or atrophy. No edema. Neurologic: Sensation grossly intact in extremities.  Symmetrical.  Speech is fluent.  Psychiatric: Judgment intact, Mood & affect appropriate for pt's clinical situation. Dermatologic: No rashes or ulcers noted.  No cellulitis or open wounds.       Labs Recent Results (from the past 2160 hour(s))  Comprehensive metabolic panel     Status: None   Collection Time: 02/06/20  4:50 PM  Result Value Ref Range   Sodium 138 135 - 145 mEq/L   Potassium 3.9 3.5 - 5.1 mEq/L   Chloride 104 96 - 112 mEq/L   CO2 26 19 - 32 mEq/L   Glucose, Bld 92 70 - 99 mg/dL   BUN 18 6 - 23 mg/dL   Creatinine, Ser 0.40 - 1.50 mg/dL   Total Bilirubin 0.4 0.2 - 1.2 mg/dL   Alkaline Phosphatase 49 39 - 117 U/L   AST 22 0 - 37 U/L   ALT 18 0 - 53 U/L   Total Protein 7.7 6.0 - 8.3 g/dL   Albumin 4.7 3.5 - 5.2 g/dL   GFR  mL/min    Comment: Calculated using the CKD-EPI Creatinine Equation (2021)   Calcium 9.7 8.4 - 10.5 mg/dL  Lipid panel     Status: Abnormal   Collection Time: 02/06/20  4:50 PM  Result Value Ref Range   Cholesterol 201 (H) 0 - 200 mg/dL    Comment: ATP III Classification       Desirable:  < 200 mg/dL               Borderline High:  200 - 239 mg/dL          High:  > = mg/dL   Triglycerides 2161 (H) 0.0 - 149.0 mg/dL    Comment: Normal:  04/05/20 mg/dLBorderline High:  150 - 199 mg/dL   HDL 5.28 41.32 mg/dL   VLDL >44.01 0.0 - [birth year]-1972 mg/dL   LDL Cholesterol 04/05/20 (H) 0 - 99 mg/dL   Total CHOL/HDL Ratio 5     Comment:                Men          Women1/2 Average Risk     3.4          3.3Average Risk          5.0          4.42X Average Risk          9.6  7.13X Average Risk          15.0          11.0                       NonHDL 159.02     Comment: NOTE:  Non-HDL goal should be 30 mg/dL higher than patient's LDL goal (i.e. LDL goal of < 70 mg/dL, would have non-HDL goal of < 100  mg/dL)  Hemoglobin L7L     Status: None   Collection Time: 02/06/20  4:50 PM  Result Value Ref Range   Hgb A1c MFr Bld 6.2 4.6 - 6.5 %    Comment: Glycemic Control Guidelines for People with Diabetes:Non Diabetic:  <6%Goal of Therapy: <7%Additional Action Suggested:  >8%     Radiology No results found.  Assessment/Plan  AAA (abdominal aortic aneurysm) (HCC) His AAA duplex today reveals a maximal aortic sac size of 3.15 cm.  This is slightly increased from his study in 2020. No changes. Avoid tobacco and maintain normal BP to avoid AAA growth. Recheck in 18 months.  Hyperlipidemia lipid control important in reducing the progression of atherosclerotic disease. Continue statin therapy     Festus Barren, MD  05/03/2020 9:04 AM    This note was created with Dragon medical transcription system.  Any errors from dictation are purely unintentional

## 2020-05-03 NOTE — Assessment & Plan Note (Signed)
lipid control important in reducing the progression of atherosclerotic disease. Continue statin therapy  

## 2020-05-03 NOTE — Assessment & Plan Note (Signed)
His AAA duplex today reveals a maximal aortic sac size of 3.15 cm.  This is slightly increased from his study in 2020. No changes. Avoid tobacco and maintain normal BP to avoid AAA growth. Recheck in 18 months.

## 2021-01-01 ENCOUNTER — Encounter: Payer: Self-pay | Admitting: Family

## 2021-01-01 ENCOUNTER — Telehealth (INDEPENDENT_AMBULATORY_CARE_PROVIDER_SITE_OTHER): Payer: Medicare Other | Admitting: Family

## 2021-01-01 VITALS — Temp 100.0°F | Ht 66.5 in | Wt 175.0 lb

## 2021-01-01 DIAGNOSIS — U071 COVID-19: Secondary | ICD-10-CM

## 2021-01-01 DIAGNOSIS — R051 Acute cough: Secondary | ICD-10-CM

## 2021-01-01 MED ORDER — PROMETHAZINE-DM 6.25-15 MG/5ML PO SYRP: 5.0000 mL | ORAL_SOLUTION | Freq: Four times a day (QID) | ORAL | 0 refills | Status: DC | PRN

## 2021-01-01 MED ORDER — MOLNUPIRAVIR EUA 200MG CAPSULE: 4.0000 | ORAL_CAPSULE | Freq: Two times a day (BID) | ORAL | 0 refills | Status: AC

## 2021-01-01 NOTE — Progress Notes (Signed)
Virtual Visit via Video   I connected with patient on 01/01/21 at 11:20 AM EST by a video enabled telemedicine application and verified that I am speaking with the correct person using two identifiers.  Location patient: Home Location provider: [employer], Office Persons participating in the virtual visit: Patient, Provider, CMA  I discussed the limitations of evaluation and management by telemedicine and the availability of in person appointments. The patient expressed understanding and agreed to proceed.  Subjective:   HPI:  70 year old male presents via video visit with concerns of cough, congestion, fatigue and back pain after testing positive for COVID on 12/27. Is was espicially concerned with the pain because he has had a history fo PNA in the past. Pain is worse with movement. No SOB.     ROS:   See pertinent positives and negatives per HPI.  Patient Active Problem List   Diagnosis Date Noted   Prediabetes 02/06/2020   Hyperlipidemia 02/06/2020   Erectile dysfunction due to arterial insufficiency 03/22/2019   Trochanteric bursitis of left hip 11/23/2017   AAA (abdominal aortic aneurysm) 10/07/2017   Vertigo 07/07/2017   Left sided colitis without complications ([place])    Low back pain 07/22/2016   Low back strain 07/22/2016    Social History   Tobacco Use   Smoking status: Former   Smokeless tobacco: Never  Substance Use Topics   Alcohol use: Yes    Current Outpatient Medications:    cetirizine (ZYRTEC) 5 MG tablet, Take 5 mg by mouth daily., Disp: , Rfl:    molnupiravir EUA (LAGEVRIO) 200 mg CAPS capsule, Take 4 capsules (800 mg total) by mouth 2 (two) times daily for 5 days., Disp: 40 capsule, Rfl: 0   promethazine-dextromethorphan (PROMETHAZINE-DM) 6.25-15 MG/5ML syrup, Take 5 mLs by mouth 4 (four) times daily as needed., Disp: 118 mL, Rfl: 0   rosuvastatin (CRESTOR) 20 MG tablet, Take 1 tablet (20 mg total) by mouth daily., Disp: 90 tablet,  Rfl: 3   sildenafil (REVATIO) 20 MG tablet, 2-5 tabs 1 hour prior to intercourse, Disp: 90 tablet, Rfl: 2  Allergies  Allergen Reactions   Infliximab Hives    (REMICADE)    Objective:   Temp 100 F (37.8 C) (Oral)    Ht 5' 6.5" (1.689 m)    Wt 175 lb (79.4 kg)    BMI 27.82 kg/m   Patient is well-developed, well-nourished in no acute distress.  Resting comfortably at home.  Head is normocephalic, atraumatic.  No labored breathing.  Speech is clear and coherent with logical content.  Patient is alert and oriented at baseline.   Assessment and Plan:    Lula was seen today for acute visit.  Diagnoses and all orders for this visit:  COVID-19 -     DG Chest 2 View; Future  Acute cough -     DG Chest 2 View; Future  Other orders -     promethazine-dextromethorphan (PROMETHAZINE-DM) 6.25-15 MG/5ML syrup; Take 5 mLs by mouth 4 (four) times daily as needed. -     molnupiravir EUA (LAGEVRIO) 200 mg CAPS capsule; Take 4 capsules (800 mg total) by mouth 2 (two) times daily for 5 days.   Call the office is symptoms worsen or persist. If SOB develops, go to the ED. Will order an outpatient xray if he feels the pain is occurring outside of movement. Sound musculoskeletal due to the frequency of his coughing. Start promethazine DM and antiviral . Do no hesitate to  call with questions.    Eulis Foster, FNP 01/01/2021

## 2021-02-17 ENCOUNTER — Ambulatory Visit (INDEPENDENT_AMBULATORY_CARE_PROVIDER_SITE_OTHER): Payer: Medicare PPO | Admitting: Family Medicine

## 2021-02-17 ENCOUNTER — Encounter: Payer: Self-pay | Admitting: Family Medicine

## 2021-02-17 ENCOUNTER — Ambulatory Visit (INDEPENDENT_AMBULATORY_CARE_PROVIDER_SITE_OTHER)
Admission: RE | Admit: 2021-02-17 | Discharge: 2021-02-17 | Disposition: A | Discharge status: Home / Self Care | Payer: Medicare PPO | Source: Ambulatory Visit | Attending: Family Medicine | Admitting: Family Medicine

## 2021-02-17 ENCOUNTER — Other Ambulatory Visit: Payer: Self-pay

## 2021-02-17 DIAGNOSIS — E782 Mixed hyperlipidemia: Secondary | ICD-10-CM

## 2021-02-17 DIAGNOSIS — R918 Other nonspecific abnormal finding of lung field: Secondary | ICD-10-CM | POA: Diagnosis not present

## 2021-02-17 DIAGNOSIS — M47814 Spondylosis without myelopathy or radiculopathy, thoracic region: Secondary | ICD-10-CM | POA: Diagnosis not present

## 2021-02-17 DIAGNOSIS — R052 Subacute cough: Secondary | ICD-10-CM

## 2021-02-17 DIAGNOSIS — R059 Cough, unspecified: Secondary | ICD-10-CM | POA: Insufficient documentation

## 2021-02-17 DIAGNOSIS — U071 COVID-19: Secondary | ICD-10-CM | POA: Diagnosis not present

## 2021-02-17 MED ORDER — SILDENAFIL CITRATE 20 MG PO TABS: ORAL_TABLET | ORAL | 11 refills | Status: DC

## 2021-02-17 MED ORDER — PREDNISONE 10 MG PO TABS: ORAL_TABLET | ORAL | 0 refills | Status: DC

## 2021-02-17 MED ORDER — BENZONATATE 200 MG PO CAPS: 200.0000 mg | ORAL_CAPSULE | Freq: Three times a day (TID) | ORAL | 1 refills | Status: DC | PRN

## 2021-02-17 MED ORDER — ROSUVASTATIN CALCIUM 20 MG PO TABS: 20.0000 mg | ORAL_TABLET | Freq: Every day | ORAL | 3 refills | Status: DC

## 2021-02-17 NOTE — Progress Notes (Signed)
Subjective:    Patient ID: Gregory Obrien, male    DOB: [birth year], 71 y.o.   MRN: [id number]  This visit occurred during the SARS-CoV-2 public health emergency.  Safety protocols were in place, including screening questions prior to the visit, additional usage of staff PPE, and extensive cleaning of exam room while observing appropriate contact time as indicated for disinfecting solutions.   HPI Pt presents for f/u of covid 19  Wt Readings from Last 3 Encounters:  02/17/21 177 lb (80.3 kg)  01/01/21 175 lb (79.4 kg)  05/03/20 175 lb 12.8 oz (79.7 kg)   27.72 kg/m   He was seen by NP Justin Mend on 01/01/21  Px molnupiravir and prometh DM Cxr ordered but never done (did not think he needed it then)   Still has a dry cough and fatigue  A little noise in his chest when he lies on L side (that is baseline since pna years ago)  Cough is dry  At times some wheezing  At times a little sob  He still walks daily /uphill and notes he has to take a break   Throat is fine  Ears are ok  No sinus pain   No fever or chills   Some fatigue    Had 4 covid vaccines   Low back pain -chronic / cough makes it worse  Hurts all the time , is a little more than baseline but not as bad as it was with covid    Patient Active Problem List   Diagnosis Date Noted   Cough 02/17/2021   Prediabetes 02/06/2020   Hyperlipidemia 02/06/2020   Erectile dysfunction due to arterial insufficiency 03/22/2019   Trochanteric bursitis of left hip 11/23/2017   AAA (abdominal aortic aneurysm) 10/07/2017   Vertigo 07/07/2017   Left sided colitis without complications ([place])    Low back pain 07/22/2016   Low back strain 07/22/2016   Past Medical History:  Diagnosis Date   Blood in stool    History of colon polyps    UC (ulcerative colitis) ([place])    Ulcerative colitis ([place])    remission since 2017, see Dr. Willow Ora   Past Surgical History:  Procedure Laterality Date   BACK SURGERY     COLONOSCOPY  WITH PROPOFOL N/A 11/25/2017   Procedure: COLONOSCOPY WITH PROPOFOL;  Surgeon: Lin Landsman, MD;  [facility] END[facility]COPY;  Service: Gastroenterology;  Laterality: N/A;   KNEE SURGERY     Social History   Tobacco Use   Smoking status: Former   Smokeless tobacco: Never  Scientific laboratory technician Use: Never used  Substance Use Topics   Alcohol use: Yes   Drug use: Never   Family History  Problem Relation Age of Onset   Heart disease Father    Heart attack Father    Drug abuse Brother    Early death Brother    Allergies  Allergen Reactions   Infliximab Hives    (REMICADE)   Current Outpatient Medications on File Prior to Visit  Medication Sig Dispense Refill   cetirizine (ZYRTEC) 5 MG tablet Take 5 mg by mouth daily.     No current facility-administered medications on file prior to visit.    Review of Systems Review of Systems  Constitutional:  Positive for malaise/fatigue. Negative for chills and fever.  HENT:  Negative for congestion, ear pain, sinus pain and sore throat.   Eyes:  Negative for blurred vision, discharge and redness.  Respiratory:  Positive for cough  and wheezing. Negative for sputum production, shortness of breath and stridor.   Cardiovascular:  Negative for chest pain, palpitations and leg swelling.  Gastrointestinal:  Negative for abdominal pain, diarrhea, nausea and vomiting.  Musculoskeletal:  Negative for myalgias.  Skin:  Negative for rash.  Neurological:  Negative for dizziness and headaches.      Objective:   Physical Exam  Physical Exam Constitutional:      General: He is not in acute distress.    Appearance: Normal appearance. He is normal weight. He is not ill-appearing or diaphoretic.  HENT:     Head: Normocephalic and atraumatic.     Nose: No congestion.     Mouth/Throat:     Mouth: Mucous membranes are moist.  Eyes:     General:        Right eye: No discharge.        Left eye: No discharge.     Conjunctiva/sclera:  Conjunctivae normal.     Pupils: Pupils are equal, round, and reactive to light.  Cardiovascular:     Rate and Rhythm: Normal rate and regular rhythm.     Heart sounds: Normal heart sounds.  Pulmonary:     Effort: Pulmonary effort is normal. No respiratory distress.     Breath sounds: Normal breath sounds. No stridor. No wheezing, rhonchi or rales.  Musculoskeletal:     Cervical back: Normal range of motion and neck supple.     Right lower leg: No edema.     Left lower leg: No edema.  Lymphadenopathy:     Cervical: No cervical adenopathy.  Skin:    General: Skin is warm and dry.     Coloration: Skin is not pale.  Neurological:     Mental Status: He is alert.  Psychiatric:        Mood and Affect: Mood normal.           Assessment & Plan:   Problem List Items Addressed This Visit       Other   Cough    approx 6 wk past covid, persistent dry cough and some fatigue  Suspect post viral cough syndrome  Reassuring exam cxr ordered-pend rad rev  Prednisone taper ordered (rev side eff, he has had before) Tessalon for cough Also delsym otc Update if not starting to improve in a week or if worsening  Consider H2 blocker if not imp      Relevant Orders   DG Chest 2 View   Hyperlipidemia   Relevant Medications   rosuvastatin (CRESTOR) 20 MG tablet   sildenafil (REVATIO) 20 MG tablet

## 2021-02-17 NOTE — Patient Instructions (Addendum)
Try the tessalon for cough Also delsym- at night   Get rest  Drink fluids   Take prednisone as directed  Goal is to stop cough cycle   Chest xray today   If symptoms worsen or change let me know

## 2021-02-17 NOTE — Assessment & Plan Note (Signed)
approx 6 wk past covid, persistent dry cough and some fatigue  Suspect post viral cough syndrome  Reassuring exam cxr ordered-pend rad rev  Prednisone taper ordered (rev side eff, he has had before) Tessalon for cough Also delsym otc Update if not starting to improve in a week or if worsening  Consider H2 blocker if not imp

## 2021-05-05 ENCOUNTER — Emergency Department: Payer: Medicare PPO

## 2021-05-05 ENCOUNTER — Emergency Department
Admission: EM | Admit: 2021-05-05 | Discharge: 2021-05-05 | Disposition: A | Discharge status: Home / Self Care | Payer: Medicare PPO | Attending: Emergency Medicine | Admitting: Emergency Medicine

## 2021-05-05 ENCOUNTER — Other Ambulatory Visit: Payer: Self-pay

## 2021-05-05 ENCOUNTER — Telehealth: Payer: Self-pay

## 2021-05-05 DIAGNOSIS — R079 Chest pain, unspecified: Secondary | ICD-10-CM | POA: Diagnosis not present

## 2021-05-05 DIAGNOSIS — R0789 Other chest pain: Secondary | ICD-10-CM | POA: Diagnosis not present

## 2021-05-05 LAB — BASIC METABOLIC PANEL
Anion gap: 10 (ref 5–15)
BUN: 20 mg/dL (ref 8–23)
CO2: 28 mmol/L (ref 22–32)
Calcium: 10.5 mg/dL — ABNORMAL HIGH (ref 8.9–10.3)
Chloride: 100 mmol/L (ref 98–111)
Creatinine, Ser: 1.26 mg/dL — ABNORMAL HIGH (ref 0.61–1.24)
GFR, Estimated: >60 mL/min (ref >60)
Glucose, Bld: 100 mg/dL — ABNORMAL HIGH (ref 70–99)
Potassium: 4.5 mmol/L (ref 3.5–5.1)
Sodium: 138 mmol/L (ref 135–145)

## 2021-05-05 LAB — CBC
HCT: 44.1 % (ref 39.0–52.0)
Hemoglobin: 14.3 g/dL (ref 13.0–17.0)
MCH: 27.8 pg (ref 26.0–34.0)
MCHC: 32.4 g/dL (ref 30.0–36.0)
MCV: 85.6 fL (ref 80.0–100.0)
Platelets: 248 10*3/uL (ref 150–400)
RBC: 5.15 MIL/uL (ref 4.22–5.81)
RDW: 14.2 % (ref 11.5–15.5)
WBC: 7.7 10*3/uL (ref 4.0–10.5)
nRBC: 0 % (ref 0.0–0.2)

## 2021-05-05 LAB — TROPONIN I (HIGH SENSITIVITY)
Troponin I (High Sensitivity): 4 ng/L (ref <18)
Troponin I (High Sensitivity): 5 ng/L (ref <18)

## 2021-05-05 LAB — D-DIMER, QUANTITATIVE: D-Dimer, Quant: 0.42 ug/mL-FEU (ref 0.00–0.50)

## 2021-05-05 MED ORDER — FAMOTIDINE 20 MG PO TABS
20.0000 mg | ORAL_TABLET | Freq: Once | ORAL | Status: AC
  Administered 2021-05-05: 20 mg via ORAL
  Filled 2021-05-05: qty 1

## 2021-05-05 MED ORDER — IBUPROFEN 400 MG PO TABS
400.0000 mg | ORAL_TABLET | ORAL | Status: AC
Start: 2021-05-05 — End: 2021-05-05
  Administered 2021-05-05: 400 mg via ORAL
  Filled 2021-05-05: qty 1

## 2021-05-05 NOTE — ED Triage Notes (Signed)
Pt c/o left sided chest pain that started around 10am, states he thought it was indigestion and took pepto with no relief. Denies N/V/Diaphoresis ?

## 2021-05-05 NOTE — ED Provider Notes (Signed)
? ?[facility] ?Provider Note ? ? ? Event Date/Time  ? First MD Initiated Contact with Patient 05/05/21 1611   ?  (approximate) ? ? ?History  ? ?Chest Pain ? ? ?HPI ? ?Gregory Obrien is a 71 y.o. male who on review of primary care note from February 2023 of this year has a history of hyperlipidemia.  Also COVID in January.  Further chart review denotes a history of ulcerative colitis and abdominal aortic aneurysm followed by vascular surgery ? ?A 71 year old patient with a history of hypercholesterolemia presents for evaluation of chest pain. Initial onset of pain was approximately 3-6 hours ago. The patient's chest pain is well-localized, is sharp and is not worse with exertion. The patient's chest pain is not middle- or left-sided, is not described as heaviness/pressure/tightness and does not radiate to the arms/jaw/neck. The patient does not complain of nausea and denies diaphoresis. The patient has no history of stroke, has no history of peripheral artery disease, has not smoked in the past 90 days, denies any history of treated diabetes, has no relevant family history of coronary artery disease (first degree relative at less than age 67), is not hypertensive and does not have an elevated BMI (>=30).  ? ?8 sandwich at [employer].  Shortly after started having pain under his breastbone sharp.  No abdominal pain no back pain except for chronic back pain. ? ?Relieved somewhat by taking Pepto-Bismol tablet and ibuprofen earlier.  Currently mild, feels that an additional 1 or 2 ibuprofen tablets would help alleviate it. ? ?Came to have evaluation make sure it is not heart related ? ?No recent illness.  Has had a bit of a persistent cough for several months after he had coronavirus.  No history of blood clots.  Not coughing up any blood.  No leg swelling.  No recent trauma travel or major surgery.  Has been living here in [place] for about the last 5 years ? ?Reports feels slightly sore  around the area of his breastbone slightly over the left side ? ?No rash ?HPI: A 71 year old patient with a history of hypercholesterolemia presents for evaluation of chest pain. Initial onset of pain was approximately 3-6 hours ago. The patient's chest pain is well-localized, is sharp and is not worse with exertion. The patient's chest pain is not middle- or left-sided, is not described as heaviness/pressure/tightness and does not radiate to the arms/jaw/neck. The patient does not complain of nausea and denies diaphoresis. The patient has no history of stroke, has no history of peripheral artery disease, has not smoked in the past 90 days, denies any history of treated diabetes, has no relevant family history of coronary artery disease (first degree relative at less than age 33), is not hypertensive and does not have an elevated BMI (>=30).  ? ? ?Physical Exam  ? ?Triage Vital Signs: ?ED Triage Vitals  ?Enc Vitals Group  ?   BP 05/05/21 1315 (!) 148/92  ?   Pulse Rate 05/05/21 1315 68  ?   Resp 05/05/21 1315 16  ?   Temp 05/05/21 1315 98 ?F (36.7 ?C)  ?   Temp Source 05/05/21 1315 Oral  ?   SpO2 05/05/21 1315 98 %  ?   Weight 05/05/21 1316 170 lb (77.1 kg)  ?   Height 05/05/21 1316 5\' 7"  (1.702 m)  ?   Head Circumference --   ?   Peak Flow --   ?   Pain Score 05/05/21 1316 7  ?  Pain Loc --   ?   Pain Edu? --   ?   Excl. in GC? --   ? ? ?Most recent vital signs: ?Vitals:  ? 05/05/21 1654 05/05/21 1919  ?BP: (!) 174/83 (!) 170/79  ?Pulse: 67 (!) 56  ?Resp: 17 16  ?Temp:    ?SpO2: 98% 100%  ? ? ? ?General: Awake, no distress.  ?CV:  Good peripheral perfusion.  Normal heart tones and rhythm. ?Resp:  Normal effort.  Clear bilaterally.  Speaks in full clear sentences ?Abd:  No distention.  Soft nontender nondistended ?Other:  No lower extremity edema.  No unilateral leg swelling.  No venous cords or congestion. ? ?Very pleasant.  Patient and wife both at the bedside. ? ? ?ED Results / Procedures / Treatments   ? ?Labs ?(all labs ordered are listed, but only abnormal results are displayed) ?Labs Reviewed  ?BASIC METABOLIC PANEL - Abnormal; Notable for the following components:  ?    Result Value  ? Glucose, Bld 100 (*)   ? Creatinine, Ser 1.26 (*)   ? Calcium 10.5 (*)   ? All other components within normal limits  ?CBC  ?D-DIMER, QUANTITATIVE  ?TROPONIN I (HIGH SENSITIVITY)  ?TROPONIN I (HIGH SENSITIVITY)  ? ? ? ?EKG ? ?Reviewed and interpreted by me at 1315 ?Heart rate 65 ?QRS 130 ?QTc 450 ?Normal sinus rhythm, right bundle branch block.  Given right bundle branch block present.  No noted ischemic abnormality, slight T wave inversion is notable in V4 but I suspect this may be secondary to his right bundle branch block.  No obvious ischemia or ST segment elevation ? ?There are no old EKGs in our system for comparison ? ? ? ?RADIOLOGY ? ? ?I personally viewed and interpreted the patient's chest x-ray is a normal chest x-ray.  After and upon my review of the patient's radiologist report, there is a small area that may represent atelectasis. ? ?Clinically, the patient does not appear to have any symptoms to suggest acute pulmonary disease such as fever new cough, productive sputum, or signs or symptoms that would suggest pneumonia. ? ?DG Chest 2 View ? ?Result Date: 05/05/2021 ?CLINICAL DATA:  A 71 year old male for since for evaluation of chest pain that started around 10 a.m. EXAM: CHEST - 2 VIEW COMPARISON:  Comparison is made with imaging from February 17, 2021. FINDINGS: Trachea is midline. Cardiomediastinal contours and hilar structures are normal. Lungs are clear aside from minimal retrocardiac airspace disease likely atelectasis. No signs of pleural effusion. No visible pneumothorax. On limited assessment there is no acute skeletal process. IMPRESSION: Minimal retrocardiac airspace disease likely atelectasis, otherwise no acute cardiopulmonary disease. Electronically Signed   By: Donzetta Kohut M.D.   On: 05/05/2021  14:47   ? ? ? ? ?PROCEDURES: ? ?Critical Care performed: No ? ?Procedures ? ? ?MEDICATIONS ORDERED IN ED: ?Medications  ?ibuprofen (ADVIL) tablet 400 mg (400 mg Oral Given 05/05/21 1652)  ?famotidine (PEPCID) tablet 20 mg (20 mg Oral Given 05/05/21 1652)  ? ? ? ?IMPRESSION / MDM / ASSESSMENT AND PLAN / ED COURSE  ?I reviewed the triage vital signs and the nursing notes. ?HEAR Score: 4         HEAR Score: 4  ?               ? ?Differential diagnosis includes, but is not limited to, ACS, aortic dissection, pulmonary embolism, cardiac tamponade, pneumothorax, pneumonia, pericarditis, myocarditis, GI-related causes including esophagitis/gastritis, and musculoskeletal chest wall  pain.   ? ?Chest pain atypical of ACS.  Sharp in nature located on the left sternal border.  No associated pulmonary symptoms or dyspnea.  Low risk for pulmonary embolism.  D-dimer is normal ? ?First troponin normal.  Second troponin within normal ? ?Aspirin not given as given presentation and clinical symptoms suspect unlikely to represent ACS.  Patient prefers use of ibuprofen ? ?HEAR Score: 4  ? ?The patient is on the cardiac monitor to evaluate for evidence of arrhythmia and/or significant heart rate changes. ? ? ?----------------------------------------- ?7:46 PM on 05/05/2021 ?----------------------------------------- ?Patient resting comfortably no distress.  Reviewed labs.  Thus far very reassuring.  Recommended follow-up with primary care and cardiology whom we have placed referral to. ? ?Return precautions and treatment recommendations and follow-up discussed with the patient who is agreeable with the plan. ? ?  ? ? ?FINAL CLINICAL IMPRESSION(S) / ED DIAGNOSES  ? ?Final diagnoses:  ?Chest pain, unspecified type  ? ? ? ?Rx / DC Orders  ? ?ED Discharge Orders   ? ?      Ordered  ?  Ambulatory referral to Cardiology       ?Comments: Chest pain, ed followup  ? 05/05/21 1945  ? ?  ?  ? ?  ? ? ? ?Note:  This document was prepared using Dragon  voice recognition software and may include unintentional dictation errors. ?  Sharyn Creamer?Elyn Krogh, MD ?05/05/21 1946 ? ?

## 2021-05-05 NOTE — Telephone Encounter (Signed)
Aware, thanks , watching for correspondence  ?

## 2021-05-05 NOTE — Telephone Encounter (Signed)
Pt walked in with mid chest pain that is continuous and sharp for about 1 hr; pt said pain level is 7-8. Pain does not radiate into neck, jaw or arms. Pt appeared pale upon arrival but pink did come back to his cheeks. Pt said feeling lightheaded on and off and SOB upon exertion and sitting. SOB appears less while sitting. Pt has not perspired and has dry skin now; no N & V.Pt refused EMS and wants his wife to drive him to [facility] ED now. Pt was assisted by w/c to pts wifes car and if pt condition changes or worsens enroute to ED pts wife will pull over and call 911.and I spoke with Caryl Pina at [facility] ED triage as Juluis Rainier pt above symptoms and pt was coming by car in next few mins. Caryl Pina voiced understanding.T 97.7 P 78 BP 140/80 reg cuff, lt arm sitting. Dr Glori Bickers is aware and sending note to Dr Glori Bickers and Louretta Shorten.  ?

## 2021-05-07 NOTE — Progress Notes (Signed)
Cardiology Office Note ? ?Date:  05/09/2021  ? ?IDAaryav Obrien, DOB 04-Mar-1950, MRN [id number] ? ?PCP:  Judy Pimple, MD  ? ?Chief Complaint  ?Patient presents with  ? New Patient (Initial Visit)  ?  Ref by Dr. Milinda Antis for evaluation of chest pain. Medications reviewed by the patient verbally.   ? ? ?HPI:  ?[name] is a 71 year old gentleman with past medical history of ?Former smoker 10 years ?Hyperlipidemia ?COVID in January 2023 ?Ulcerative colitis ?Abdominal aortic aneurysm followed by vascular, 3.2 cm ?Seen in the emergency room May 05, 2021 for chest pain ? ?Seen in the emergency room May 05, 2021, had a sandwich from [employer] shortly after developed pain radiating through to his back ?After work-up in the ER symptoms felt not secondary to cardiac etiology ?Enzymes negative ?D-dimer normal, low risk for PE ?Consult was placed for follow-up with cardiology ?Pain left of sternum, ?Better with IBP ? ?Total cholesterol greater than 230 ?Reports he is not taking his Crestor ?A1c 6.2, prediabetic range ? ?Father with pacer ? ?EKG personally reviewed by myself on todays visit ?Normal sinus rhythm rate 63 bpm right bundle branch block ? ?PMH:   has a past medical history of Blood in stool, History of colon polyps, UC (ulcerative colitis) ([facility]), and Ulcerative colitis ([facility]). ? ?PSH:    ?Past Surgical History:  ?Procedure Laterality Date  ? BACK SURGERY    ? COLONOSCOPY WITH PROPOFOL N/A 11/25/2017  ? Procedure: COLONOSCOPY WITH PROPOFOL;  Surgeon: Toney Reil, MD;  Location: [facility] ENDOSCOPY;  Service: Gastroenterology;  Laterality: N/A;  ? KNEE SURGERY    ? ? ?Current Outpatient Medications  ?Medication Sig Dispense Refill  ? cetirizine (ZYRTEC) 5 MG tablet Take 5 mg by mouth daily.    ? famotidine (PEPCID) 20 MG tablet Take 1 tablet (20 mg total) by mouth 2 (two) times daily. 60 tablet 5  ? rosuvastatin (CRESTOR) 20 MG tablet Take 1 tablet (20 mg total) by mouth daily. 90 tablet 3  ? sildenafil  (REVATIO) 20 MG tablet 2-5 tabs 1 hour prior to intercourse 50 tablet 11  ? ?No current facility-administered medications for this visit.  ? ? ? ?Allergies:   Infliximab  ? ?Social History:  The patient  reports that he quit smoking about 30 years ago. His smoking use included cigarettes. He has a 10.00 pack-year smoking history. He has never used smokeless tobacco. He reports current alcohol use. He reports that he does not use drugs.  ? ?Family History:   family history includes Drug abuse in his brother; Early death in his brother; Heart attack in his father; Heart disease in his father.  ? ? ?Review of Systems: ?Review of Systems  ?Constitutional: Negative.   ?HENT: Negative.    ?Respiratory: Negative.    ?Cardiovascular:  Positive for chest pain.  ?Gastrointestinal: Negative.   ?Musculoskeletal: Negative.   ?Neurological: Negative.   ?Psychiatric/Behavioral: Negative.    ?All other systems reviewed and are negative. ? ? ?PHYSICAL EXAM: ?VS:  BP 130/68 (BP Location: Right Arm, Patient Position: Sitting, Cuff Size: Normal)   Pulse 63   Ht 5\' 6"  (1.676 m)   Wt 177 lb (80.3 kg)   SpO2 98%   BMI 28.57 kg/m?  , BMI Body mass index is 28.57 kg/m?. ?GEN: Well nourished, well developed, in no acute distress ?HEENT: normal ?Neck: no JVD, carotid bruits, or masses ?Cardiac: RRR; no murmurs, rubs, or gallops,no edema  ?Respiratory:  clear to auscultation bilaterally,  normal work of breathing ?GI: soft, nontender, nondistended, + BS ?MS: no deformity or atrophy ?Skin: warm and dry, no rash ?Neuro:  Strength and sensation are intact ?Psych: euthymic mood, full affect ? ?Recent Labs: ?05/05/2021: BUN 20; Creatinine, Ser 1.26; Hemoglobin 14.3; Platelets 248; Potassium 4.5; Sodium 138  ? ? ?Lipid Panel ?Lab Results  ?Component Value Date  ? CHOL 237 (H) 05/08/2021  ? HDL 42.50 05/08/2021  ? LDLCALC 125 (H) 02/06/2020  ? TRIG 391.0 (H) 05/08/2021  ? ?  ? ?Wt Readings from Last 3 Encounters:  ?05/09/21 177 lb (80.3 kg)   ?05/08/21 177 lb 6 oz (80.5 kg)  ?05/05/21 170 lb (77.1 kg)  ?  ? ? ?ASSESSMENT AND PLAN: ? ?Problem List Items Addressed This Visit   ? ?  ? Cardiology Problems  ? Hyperlipidemia  ? Relevant Orders  ? EKG 12-Lead  ? AAA (abdominal aortic aneurysm) ([facility])  ? Relevant Orders  ? EKG 12-Lead  ?  ? Other  ? Prediabetes  ? Relevant Orders  ? EKG 12-Lead  ? ?Other Visit Diagnoses   ? ? Chest pain of uncertain etiology    -  Primary  ? Relevant Orders  ? EKG 12-Lead  ? ?  ? ?Angina ?Recent evaluation in the emergency room for left side chest pain ?Lasting several hours, cardiac enzymes negative ?Nonspecific EKG changes ?Risk factors include prediabetes, poorly controlled hypertension ?Last PAD, AAA ?Prior smoking history 10 years ?After long discussion we will order cardiac CTA for further evaluation ? ?Hyperlipidemia ?CT scan above to help guide therapy ?Ideally will need to start his statin ?Currently not taking Crestor 20 ? ? ? ? Total encounter time more than ? Greater than 50% was spent in counseling and coordination of care with the patient ? ? ? ?Signed, ?Dossie Arbour, M.D., Ph.D. ?[facility] [place], [place] ?[phone] ?

## 2021-05-08 ENCOUNTER — Encounter: Payer: Self-pay | Admitting: Family Medicine

## 2021-05-08 ENCOUNTER — Ambulatory Visit (INDEPENDENT_AMBULATORY_CARE_PROVIDER_SITE_OTHER): Payer: Medicare PPO | Admitting: Family Medicine

## 2021-05-08 VITALS — BP 128/80 | HR 72 | Temp 98.1°F | Ht 67.0 in | Wt 177.4 lb

## 2021-05-08 DIAGNOSIS — R0789 Other chest pain: Secondary | ICD-10-CM

## 2021-05-08 DIAGNOSIS — R079 Chest pain, unspecified: Secondary | ICD-10-CM | POA: Insufficient documentation

## 2021-05-08 DIAGNOSIS — K219 Gastro-esophageal reflux disease without esophagitis: Secondary | ICD-10-CM | POA: Diagnosis not present

## 2021-05-08 DIAGNOSIS — E782 Mixed hyperlipidemia: Secondary | ICD-10-CM

## 2021-05-08 DIAGNOSIS — R052 Subacute cough: Secondary | ICD-10-CM

## 2021-05-08 LAB — LIPID PANEL
Cholesterol: 237 mg/dL — ABNORMAL HIGH (ref 0–200)
HDL: 42.5 mg/dL (ref >39.00)
NonHDL: 194.79
Total CHOL/HDL Ratio: 6
Triglycerides: 391 mg/dL — ABNORMAL HIGH (ref 0.0–149.0)
VLDL: 78.2 mg/dL — ABNORMAL HIGH (ref 0.0–40.0)

## 2021-05-08 LAB — LDL CHOLESTEROL, DIRECT: Direct LDL: 169 mg/dL

## 2021-05-08 MED ORDER — FAMOTIDINE 20 MG PO TABS: 20.0000 mg | ORAL_TABLET | Freq: Two times a day (BID) | ORAL | 5 refills | Status: DC

## 2021-05-08 NOTE — Assessment & Plan Note (Addendum)
Heartburn  ?Worse at night  ?? If adding to chest discomfort /likely  ? ?pepcid 20 mg twice daily sent to pharmacy  ?Given handouts/enc dietary change if needed ?Also not eating late  ?

## 2021-05-08 NOTE — Assessment & Plan Note (Signed)
Disc goals for lipids and reasons to control them ?Rev last labs with pt ?Rev low sat fat diet in detail ?Lab today  ?Taking crestor 20 mg daily  ? ?

## 2021-05-08 NOTE — Progress Notes (Signed)
? ?Subjective:  ? ? Patient ID: Gregory Obrien, male    DOB: 01/16/1950, 71 y.o.   MRN: [id number] ? ?HPI ?Pt presents for f/u of ER visit for chest discomfort  ?Had questions about getting a shingrix vaccine  ? ?Wt Readings from Last 3 Encounters:  ?05/08/21 177 lb 6 oz (80.5 kg)  ?05/05/21 170 lb (77.1 kg)  ?02/17/21 177 lb (80.3 kg)  ? ?27.78 kg/m? ? ?Chest pain is improved  ?Ibuprofen may have helped  ?Antacid did not help  ? ? ? ?He was seen in ER on 51 ?Presented with substernal chest pain for several hours after eating at [employer] with no assoc symptoms except brief pale complexion  ?Not exertional  ?Without cardiac risk factors except family history but does have hx of AAA watched closely by vascular  ? ?Father had a pacemaker put in at 5692  ?Mother-no heart problems  ? ? ? ?Bp was elevated at time of presentation  ?BP Readings from Last 3 Encounters:  ?05/08/21 128/80  ?05/05/21 (!) 154/86  ?02/17/21 116/68  ? ?Pulse Readings from Last 3 Encounters:  ?05/08/21 72  ?05/05/21 (!) 56  ?02/17/21 67  ? ? ?Labs  ?Results for orders placed or performed during the hospital encounter of 05/05/21  ?Basic metabolic panel  ?Result Value Ref Range  ? Sodium 138 135 - 145 mmol/L  ? Potassium 4.5 3.5 - 5.1 mmol/L  ? Chloride 100 98 - 111 mmol/L  ? CO2 28 22 - 32 mmol/L  ? Glucose, Bld 100 (H) 70 - 99 mg/dL  ? BUN 20 8 - 23 mg/dL  ? Creatinine, Ser 1.26 (H) 0.61 - 1.24 mg/dL  ? Calcium 10.5 (H) 8.9 - 10.3 mg/dL  ? GFR, Estimated >60 >60 mL/min  ? Anion gap 10 5 - 15  ?CBC  ?Result Value Ref Range  ? WBC 7.7 4.0 - 10.5 K/uL  ? RBC 5.15 4.22 - 5.81 MIL/uL  ? Hemoglobin 14.3 13.0 - 17.0 g/dL  ? HCT 44.1 39.0 - 52.0 %  ? MCV 85.6 80.0 - 100.0 fL  ? MCH 27.8 26.0 - 34.0 pg  ? MCHC 32.4 30.0 - 36.0 g/dL  ? RDW 14.2 11.5 - 15.5 %  ? Platelets 248 150 - 400 K/uL  ? nRBC 0.0 0.0 - 0.2 %  ?D-dimer, quantitative  ?Result Value Ref Range  ? D-Dimer, Quant 0.42 0.00 - 0.50 ug/mL-FEU  ?Troponin I (High Sensitivity)  ?Result Value Ref  Range  ? Troponin I (High Sensitivity) 4 <18 ng/L  ?Troponin I (High Sensitivity)  ?Result Value Ref Range  ? Troponin I (High Sensitivity) 5 <18 ng/L  ?  ?EKG with NSR and RBBB ?T wave inv noted in V4-poss due to the RBBB ?No ST changes  ? ?DG Chest 2 View ? ?Result Date: 05/05/2021 ?CLINICAL DATA:  A 71 year old male for since for evaluation of chest pain that started around 10 a.m. EXAM: CHEST - 2 VIEW COMPARISON:  Comparison is made with imaging from February 17, 2021. FINDINGS: Trachea is midline. Cardiomediastinal contours and hilar structures are normal. Lungs are clear aside from minimal retrocardiac airspace disease likely atelectasis. No signs of pleural effusion. No visible pneumothorax. On limited assessment there is no acute skeletal process. IMPRESSION: Minimal retrocardiac airspace disease likely atelectasis, otherwise no acute cardiopulmonary disease. Electronically Signed   By: Donzetta KohutGeoffrey  Wile M.D.   On: 05/05/2021 14:47   ? ?Per ER provider: ?Chest pain atypical of ACS.  Sharp in nature located  on the left sternal border.  No associated pulmonary symptoms or dyspnea.  Low risk for pulmonary embolism.  D-dimer is normal ?  ?First troponin normal.  Second troponin within normal ?  ?Aspirin not given as given presentation and clinical symptoms suspect unlikely to represent ACS.  Patient prefers use of ibuprofen ?  ?HEAR Score: 4  ?  ?The patient is on the cardiac monitor to evaluate for evidence of arrhythmia and/or significant heart rate changes. ? ?Symptoms did resolve  ?Recommended f/u with pcp and also ref to cardiology for further eval ? ?Has heartburn  ?Worse at night  ? ? ?He has an appt with Dr Lewie Loron tomorrow ? ?Lab Results  ?Component Value Date  ? CHOL 201 (H) 02/06/2020  ? HDL 42.30 02/06/2020  ? LDLCALC 125 (H) 02/06/2020  ? TRIG 168.0 (H) 02/06/2020  ? CHOLHDL 5 02/06/2020  ? ?Wants to check this today  ?Did eat bacon (not usually)  ? ?Crestor 20 mg daily  ? ?Patient Active Problem List  ?  Diagnosis Date Noted  ? GERD (gastroesophageal reflux disease) 05/08/2021  ? Cough 02/17/2021  ? Prediabetes 02/06/2020  ? Hyperlipidemia 02/06/2020  ? Erectile dysfunction due to arterial insufficiency 03/22/2019  ? Trochanteric bursitis of left hip 11/23/2017  ? AAA (abdominal aortic aneurysm) (HCC) 10/07/2017  ? Vertigo 07/07/2017  ? Left sided colitis without complications (HCC)   ? Low back pain 07/22/2016  ? Low back strain 07/22/2016  ? ?Past Medical History:  ?Diagnosis Date  ? Blood in stool   ? History of colon polyps   ? UC (ulcerative colitis) (HCC)   ? Ulcerative colitis (HCC)   ? remission since 2017, see Dr. Beatrix Fetters  ? ?Past Surgical History:  ?Procedure Laterality Date  ? BACK SURGERY    ? COLONOSCOPY WITH PROPOFOL N/A 11/25/2017  ? Procedure: COLONOSCOPY WITH PROPOFOL;  Surgeon: Toney Reil, MD;  Location: [facility] ENDOSCOPY;  Service: Gastroenterology;  Laterality: N/A;  ? KNEE SURGERY    ? ?Social History  ? ?Tobacco Use  ? Smoking status: Former  ? Smokeless tobacco: Never  ?Vaping Use  ? Vaping Use: Never used  ?Substance Use Topics  ? Alcohol use: Yes  ? Drug use: Never  ? ?Family History  ?Problem Relation Age of Onset  ? Heart disease Father   ? Heart attack Father   ? Drug abuse Brother   ? Early death Brother   ? ?Allergies  ?Allergen Reactions  ? Infliximab Hives  ?  ([place])  ? ?Current Outpatient Medications on File Prior to Visit  ?Medication Sig Dispense Refill  ? cetirizine (ZYRTEC) 5 MG tablet Take 5 mg by mouth daily.    ? rosuvastatin (CRESTOR) 20 MG tablet Take 1 tablet (20 mg total) by mouth daily. 90 tablet 3  ? sildenafil (REVATIO) 20 MG tablet 2-5 tabs 1 hour prior to intercourse 50 tablet 11  ? ?No current facility-administered medications on file prior to visit.  ?  ? ?Review of Systems  ?Constitutional:  Negative for activity change, appetite change, fatigue, fever and unexpected weight change.  ?HENT:  Negative for congestion, rhinorrhea, sore throat and trouble  swallowing.   ?Eyes:  Negative for pain, redness, itching and visual disturbance.  ?Respiratory:  Positive for cough. Negative for chest tightness, shortness of breath and wheezing.   ?     Occ cough  ?Cardiovascular:  Negative for chest pain, palpitations and leg swelling.  ?     Chest discomfort is sharp  and substernal ?Comes and goes   ?Gastrointestinal:  Negative for abdominal pain, blood in stool, constipation, diarrhea and nausea.  ?     Heartburn ?Acid in throat at night  ?  ?Endocrine: Negative for cold intolerance, heat intolerance, polydipsia and polyuria.  ?Genitourinary:  Negative for difficulty urinating, dysuria, frequency and urgency.  ?Musculoskeletal:  Negative for arthralgias, joint swelling and myalgias.  ?Skin:  Negative for pallor and rash.  ?Neurological:  Negative for dizziness, tremors, weakness, numbness and headaches.  ?Hematological:  Negative for adenopathy. Does not bruise/bleed easily.  ?Psychiatric/Behavioral:  Negative for decreased concentration and dysphoric mood. The patient is not nervous/anxious.   ? ?   ?Objective:  ? Physical Exam ?Constitutional:   ?   General: He is not in acute distress. ?   Appearance: Normal appearance. He is well-developed and normal weight. He is not ill-appearing or diaphoretic.  ?HENT:  ?   Head: Normocephalic and atraumatic.  ?   Mouth/Throat:  ?   Pharynx: Oropharynx is clear.  ?Eyes:  ?   General: No scleral icterus. ?   Conjunctiva/sclera: Conjunctivae normal.  ?   Pupils: Pupils are equal, round, and reactive to light.  ?Neck:  ?   Thyroid: No thyromegaly.  ?   Vascular: No carotid bruit or JVD.  ?Cardiovascular:  ?   Rate and Rhythm: Normal rate and regular rhythm.  ?   Heart sounds: Normal heart sounds.  ?  No gallop.  ?Pulmonary:  ?   Effort: Pulmonary effort is normal. No respiratory distress.  ?   Breath sounds: Normal breath sounds. No wheezing or rales.  ?Abdominal:  ?   General: There is no distension or abdominal bruit.  ?   Palpations:  Abdomen is soft. There is no mass.  ?   Tenderness: There is no abdominal tenderness. There is no guarding or rebound.  ?Musculoskeletal:  ?   Cervical back: Normal range of motion and neck supple.  ?   Rig

## 2021-05-08 NOTE — Patient Instructions (Addendum)
I do recommend the shingrix vaccine  ? ?If you are interested in the new shingles vaccine (Shingrix) - call your local pharmacy to check on coverage and availability  ?If affordable, get on a wait list at your pharmacy to get the vaccine. ? ?Take care of yourself  ?Watch your diet for reflux as much as you can  ?Let me know if this helps in the next 1-2 weeks  ? ?Follow up with cardiology as planned  ? ?If chest pain or shortness of breath /worsen or become severe go to the ER  ? ? ? ? ?

## 2021-05-08 NOTE — Assessment & Plan Note (Addendum)
Improved ?Stopped tessalon, still an occ cough  ?May be worsened by reflux  ?Will try pepcid 20 mg bid  ?

## 2021-05-08 NOTE — Assessment & Plan Note (Signed)
Atypical chest pain /seen in Er on 5/1  ?Reviewed hospital records, lab results and studies in detail  ?Reassuring work up  ?Has h/o hyperlipidemia on statin and AAA ?Noted some heartburn/GERD symptoms  ?Nl exam today and nl bp  ?Plan trial of pepcid 20 mg bid  ?appt with cardiology tomorrow  ?ER precautions given  ?

## 2021-05-09 ENCOUNTER — Ambulatory Visit: Payer: Medicare PPO | Admitting: Cardiovascular Disease

## 2021-05-09 ENCOUNTER — Encounter: Payer: Self-pay | Admitting: Cardiovascular Disease

## 2021-05-09 VITALS — BP 130/68 | HR 63 | Ht 66.0 in | Wt 177.0 lb

## 2021-05-09 DIAGNOSIS — R079 Chest pain, unspecified: Secondary | ICD-10-CM | POA: Diagnosis not present

## 2021-05-09 DIAGNOSIS — I209 Angina pectoris, unspecified: Secondary | ICD-10-CM | POA: Diagnosis not present

## 2021-05-09 DIAGNOSIS — I714 Abdominal aortic aneurysm, without rupture, unspecified: Secondary | ICD-10-CM

## 2021-05-09 DIAGNOSIS — R072 Precordial pain: Secondary | ICD-10-CM | POA: Diagnosis not present

## 2021-05-09 DIAGNOSIS — R7303 Prediabetes: Secondary | ICD-10-CM

## 2021-05-09 DIAGNOSIS — E782 Mixed hyperlipidemia: Secondary | ICD-10-CM | POA: Diagnosis not present

## 2021-05-09 MED ORDER — METOPROLOL TARTRATE 50 MG PO TABS: ORAL_TABLET | ORAL | 0 refills | Status: DC

## 2021-05-09 NOTE — Patient Instructions (Addendum)
Medication Instructions:  ?No changes ? ?If you need a refill on your cardiac medications before your next appointment, please call your pharmacy.  ? ?Lab work: ?No new labs needed ? ?Testing/Procedures: ? ?Your cardiac CT has been scheduled for Monday, May 8th at 2:30 pm at: ? ?[facility] ?[address] ?[address] ?[place], [place] [address] ?[phone] ? ?Please arrive 15 mins early for check-in and test prep. ? ? ?Please follow these instructions carefully (unless otherwise directed): ? ?Hold all erectile dysfunction medications at least 3 days (72 hrs) prior to test. ? ?On the Night Before the Test: ?Be sure to Drink plenty of water. ?Do not consume any caffeinated/decaffeinated beverages or chocolate 12 hours prior to your test. ? ?On the Day of the Test: ?Drink plenty of water until 1 hour prior to the test. ?Do not eat any food 4 hours prior to the test. ?You may take your regular medications prior to the test.  ?Take metoprolol (Lopressor) 50 mg two hours prior to test. This has been called ito your pharmacy. ? ? ?After the Test: ?Drink plenty of water. ?After receiving IV contrast, you may experience a mild flushed feeling. This is normal. ?On occasion, you may experience a mild rash up to 24 hours after the test. This is not dangerous. If this occurs, you can take Benadryl 25 mg and increase your fluid intake. ?If you experience trouble breathing, this can be serious. If it is severe call 911 IMMEDIATELY. If it is mild, please call our office. ? ? ?Please allow 2-4 weeks for scheduling of routine cardiac CTs. Some insurance companies require a pre-authorization which may delay scheduling of this test.  ? ?For non-scheduling related questions, please contact the cardiac imaging nurse navigator should you have any questions/concerns: ?Rockwell Alexandria, Cardiac Imaging Nurse Navigator ?Larey Brick, Cardiac Imaging Nurse Navigator ?[facility] ?Direct Office Dial: [phone]  ? ?For scheduling needs, including cancellations and rescheduling, please call [place], [phone]. ? ?   ?Follow-Up: ?At [facility], you and your health needs are our priority.  As part of our continuing mission to provide you with exceptional heart care, we have created designated Provider Care Teams.  These Care Teams include your primary Cardiologist (physician) and Advanced Practice Providers (APPs -  Physician Assistants and Nurse Practitioners) who all work together to provide you with the care you need, when you need it. ? ?You will need a follow up appointment as needed ? ?Providers on your designated Care Team:   ?Nicolasa Ducking, NP ?Eula Listen, PA-C ?Cadence Fransico Michael, PA-C ? ?COVID-19 Vaccine Information can be found at: [email or web address] For questions related to vaccine distribution or appointments, please email [email or web address] or call [phone].  ? ?

## 2021-05-12 ENCOUNTER — Other Ambulatory Visit: Payer: Self-pay

## 2021-05-12 ENCOUNTER — Ambulatory Visit
Admission: RE | Admit: 2021-05-12 | Discharge: 2021-05-12 | Disposition: A | Discharge status: Home / Self Care | Payer: Medicare PPO | Source: Ambulatory Visit | Attending: Cardiovascular Disease | Admitting: Cardiovascular Disease

## 2021-05-12 DIAGNOSIS — R072 Precordial pain: Secondary | ICD-10-CM | POA: Diagnosis not present

## 2021-05-12 DIAGNOSIS — I2510 Atherosclerotic heart disease of native coronary artery without angina pectoris: Secondary | ICD-10-CM | POA: Diagnosis not present

## 2021-05-12 MED ORDER — IOHEXOL 350 MG/ML SOLN
75.0000 mL | Freq: Once | INTRAVENOUS | Status: AC | PRN
  Administered 2021-05-12: 75 mL via INTRAVENOUS

## 2021-05-12 MED ORDER — NITROGLYCERIN 0.4 MG SL SUBL
0.8000 mg | SUBLINGUAL_TABLET | Freq: Once | SUBLINGUAL | Status: AC
Start: 2021-05-12 — End: 2021-05-12
  Administered 2021-05-12: 0.8 mg via SUBLINGUAL

## 2021-05-12 NOTE — Progress Notes (Signed)
Patient tolerated procedure well. Ambulate w/o difficulty. Denies light headedness or being dizzy. Sitting in chair drinking water provided. Encouraged to drink extra water today and reasoning explained. Verbalized understanding. All questions answered. ABC intact. No further needs. Discharge from procedure area w/o issues.   °

## 2021-05-13 ENCOUNTER — Telehealth: Payer: Self-pay | Admitting: Emergency Medicine

## 2021-05-13 DIAGNOSIS — I2510 Atherosclerotic heart disease of native coronary artery without angina pectoris: Secondary | ICD-10-CM | POA: Insufficient documentation

## 2021-05-13 NOTE — H&P (View-Only) (Signed)
Cardiology Office Note ? ?Date:  05/14/2021  ? ?IDJaheim Obrien, DOB 1950-08-06, MRN [id number] ? ?PCP:  Abner Greenspan, MD  ? ?Chief Complaint  ?Patient presents with  ? Follow up CTA   ?  Patient c/o chest pain yesterday. Medications reviewed by the patient verbally.   ? ? ?HPI:  ?Gregory Obrien is a 71 year old gentleman with past medical history of ?Former smoker 10 years ?Hyperlipidemia ?COVID in January 2023 ?Ulcerative colitis ?Abdominal aortic aneurysm followed by vascular, 3.2 cm ?Seen in the emergency room May 05, 2021 for chest pain ?Who presents today for follow-up of his coronary artery disease, angina ? ?Last seen in clinic 5 days ago May 09, 2021 for chest pain/concern for angina ? ?Cardiac CTA performed May 12 2021, images pulled up and reviewed ? results discussed in detail, images pulled up and reviewed ?1. Coronary calcium score of 954. This was 85th percentile for age ?and sex matched control. ?2. Normal coronary origin with right dominance. ?3. Calcified plaque in the proximal to mid RCA causing severe ?stenosis (>70%). ?4. Calcified plaque in the proximal to mid LAD causing moderate ?stenosis (50-69%). ?5. CAD-RADS 4 Severe stenosis. Cardiac catheterization is ?recommended. ? ?Reports having episode of chest pain yesterday ?Lasted 1 hour, resolved without intervention ? ?EKG personally reviewed by myself on todays visit ?Normal sinus rhythm rate 66 bpm right bundle branch block ? ?Prior records reviewed  ?Seen in the emergency room May 05, 2021, had a sandwich from [employer] shortly after developed pain radiating through to his back ?Enzymes negative ?D-dimer normal, low risk for PE ?Pain left of sternum, ? ?Total cholesterol greater than 230, has been off the Crestor ?A1c 6.2, prediabetic range ? ?Father with pacer ? ? ?PMH:   has a past medical history of Blood in stool, History of colon polyps, UC (ulcerative colitis) ([place]), and Ulcerative colitis ([place]). ? ?PSH:    ?Past Surgical  History:  ?Procedure Laterality Date  ? BACK SURGERY    ? COLONOSCOPY WITH PROPOFOL N/A 11/25/2017  ? Procedure: COLONOSCOPY WITH PROPOFOL;  Surgeon: Lin Landsman, MD;  Location: [facility] ENDOSCOPY;  Service: Gastroenterology;  Laterality: N/A;  ? KNEE SURGERY    ? ? ?Current Outpatient Medications  ?Medication Sig Dispense Refill  ? cetirizine (ZYRTEC) 5 MG tablet Take 5 mg by mouth daily.    ? famotidine (PEPCID) 20 MG tablet Take 1 tablet (20 mg total) by mouth 2 (two) times daily. 60 tablet 5  ? rosuvastatin (CRESTOR) 20 MG tablet Take 1 tablet (20 mg total) by mouth daily. 90 tablet 3  ? sildenafil (REVATIO) 20 MG tablet 2-5 tabs 1 hour prior to intercourse 50 tablet 11  ? ?No current facility-administered medications for this visit.  ? ? ?Allergies:   Infliximab  ? ?Social History:  The patient  reports that he quit smoking about 30 years ago. His smoking use included cigarettes. He has a 10.00 pack-year smoking history. He has never used smokeless tobacco. He reports current alcohol use. He reports that he does not use drugs.  ? ?Family History:   family history includes Drug abuse in his brother; Early death in his brother; Heart attack in his father; Heart disease in his father.  ? ? ?Review of Systems: ?Review of Systems  ?Constitutional: Negative.   ?HENT: Negative.    ?Respiratory: Negative.    ?Cardiovascular:  Positive for chest pain.  ?Gastrointestinal: Negative.   ?Musculoskeletal: Negative.   ?Neurological: Negative.   ?Psychiatric/Behavioral: Negative.    ?  All other systems reviewed and are negative. ? ? ?PHYSICAL EXAM: ?VS:  BP 120/62 (BP Location: Left Arm, Patient Position: Sitting, Cuff Size: Normal)   Pulse 66   Ht 5\' 6"  (1.676 m)   Wt 174 lb 4 oz (79 kg)   SpO2 97%   BMI 28.12 kg/m?  , BMI Body mass index is 28.12 kg/m?Marland Kitchen ?Constitutional:  oriented to person, place, and time. No distress.  ?HENT:  ?Head: Grossly normal ?Eyes:  no discharge. No scleral icterus.  ?Neck: No JVD, no  carotid bruits  ?Cardiovascular: Regular rate and rhythm, no murmurs appreciated ?Pulmonary/Chest: Clear to auscultation bilaterally, no wheezes or rails ?Abdominal: Soft.  no distension.  no tenderness.  ?Musculoskeletal: Normal range of motion ?Neurological:  normal muscle tone. Coordination normal. No atrophy ?Skin: Skin warm and dry ?Psychiatric: normal affect, pleasant ? ?Recent Labs: ?05/05/2021: BUN 20; Creatinine, Ser 1.26; Hemoglobin 14.3; Platelets 248; Potassium 4.5; Sodium 138  ? ? ?Lipid Panel ?Lab Results  ?Component Value Date  ? CHOL 237 (H) 05/08/2021  ? HDL 42.50 05/08/2021  ? LDLCALC 125 (H) 02/06/2020  ? TRIG 391.0 (H) 05/08/2021  ? ? ?Wt Readings from Last 3 Encounters:  ?05/14/21 174 lb 4 oz (79 kg)  ?05/09/21 177 lb (80.3 kg)  ?05/08/21 177 lb 6 oz (80.5 kg)  ?  ? ? ?ASSESSMENT AND PLAN: ? ?Problem List Items Addressed This Visit   ? ?  ? Cardiology Problems  ? CAD (coronary artery disease)  ? Relevant Orders  ? EKG 12-Lead  ? Hyperlipidemia  ? Relevant Orders  ? EKG 12-Lead  ? AAA (abdominal aortic aneurysm) ([place]) - Primary  ?  ? Other  ? Prediabetes  ? ?Other Visit Diagnoses   ? ? Angina pectoris ([place])      ? Relevant Orders  ? EKG 12-Lead  ? ?  ?Angina ?Reports having recent episodes of chest pain, once was seen in the emergency room work-up negative ?Episode of chest pain yesterday lasting 1 hour ?Cardiac CTA with severe stenosis of proximal to mid RCA also with LAD disease ?Given anginal symptoms we have recommended cardiac catheterization ?Recommend he continue on his Crestor, start aspirin 81 mg daily ?Tentatively scheduled for tomorrow ?Labs have already been completed ?I have reviewed the risks, indications, and alternatives to cardiac catheterization, possible angioplasty, and stenting with the patient. Risks include but are not limited to bleeding, infection, vascular injury, stroke, myocardial infection, arrhythmia, kidney injury, radiation-related injury in the case of prolonged  fluoroscopy use, emergency cardiac surgery, and death. The patient understands the risks of serious complication is 1-2 in [id number] with diagnostic cardiac cath and 1-2% or less with angioplasty/stenting.  ? ? ?Hyperlipidemia ?Crestor 20 daily, has not been taking recently  ?numbers well above goal ? ? ? ? ? ? ?Signed, ?Esmond Plants, M.D., Ph.D. ?[facility] [place], [place] ?[phone] ?

## 2021-05-13 NOTE — Progress Notes (Signed)
Cardiology Office Note ? ?Date:  05/14/2021  ? ?ID:  Gregory Obrien, DOB 05/02/1950, MRN [id number] ? ?PCP:  Tower, Marne A, MD  ? ?Chief Complaint  ?Patient presents with  ? Follow up CTA   ?  Patient c/o chest pain yesterday. Medications reviewed by the patient verbally.   ? ? ?HPI:  ?Gregory Obrien is a 70-year-old gentleman with past medical history of ?Former smoker 10 years ?Hyperlipidemia ?COVID in January 2023 ?Ulcerative colitis ?Abdominal aortic aneurysm followed by vascular, 3.2 cm ?Seen in the emergency room May 05, 2021 for chest pain ?Who presents today for follow-up of his coronary artery disease, angina ? ?Last seen in clinic 5 days ago May 09, 2021 for chest pain/concern for angina ? ?Cardiac CTA performed May 12 2021, images pulled up and reviewed ? results discussed in detail, images pulled up and reviewed ?1. Coronary calcium score of 954. This was 85th percentile for age ?and sex matched control. ?2. Normal coronary origin with right dominance. ?3. Calcified plaque in the proximal to mid RCA causing severe ?stenosis (>70%). ?4. Calcified plaque in the proximal to mid LAD causing moderate ?stenosis (50-69%). ?5. CAD-RADS 4 Severe stenosis. Cardiac catheterization is ?recommended. ? ?Reports having episode of chest pain yesterday ?Lasted 1 hour, resolved without intervention ? ?EKG personally reviewed by myself on todays visit ?Normal sinus rhythm rate 66 bpm right bundle branch block ? ?Prior records reviewed  ?Seen in the emergency room May 05, 2021, had a sandwich from [employer] shortly after developed pain radiating through to his back ?Enzymes negative ?D-dimer normal, low risk for PE ?Pain left of sternum, ? ?Total cholesterol greater than 230, has been off the Crestor ?A1c 6.2, prediabetic range ? ?Father with pacer ? ? ?PMH:   has a past medical history of Blood in stool, History of colon polyps, UC (ulcerative colitis) (HCC), and Ulcerative colitis (HCC). ? ?PSH:    ?Past Surgical  History:  ?Procedure Laterality Date  ? BACK SURGERY    ? COLONOSCOPY WITH PROPOFOL N/A 11/25/2017  ? Procedure: COLONOSCOPY WITH PROPOFOL;  Surgeon: Vanga, Rohini Reddy, MD;  Location: [facility] ENDOSCOPY;  Service: Gastroenterology;  Laterality: N/A;  ? KNEE SURGERY    ? ? ?Current Outpatient Medications  ?Medication Sig Dispense Refill  ? cetirizine (ZYRTEC) 5 MG tablet Take 5 mg by mouth daily.    ? famotidine (PEPCID) 20 MG tablet Take 1 tablet (20 mg total) by mouth 2 (two) times daily. 60 tablet 5  ? rosuvastatin (CRESTOR) 20 MG tablet Take 1 tablet (20 mg total) by mouth daily. 90 tablet 3  ? sildenafil (REVATIO) 20 MG tablet 2-5 tabs 1 hour prior to intercourse 50 tablet 11  ? ?No current facility-administered medications for this visit.  ? ? ?Allergies:   Infliximab  ? ?Social History:  The patient  reports that he quit smoking about 30 years ago. His smoking use included cigarettes. He has a 10.00 pack-year smoking history. He has never used smokeless tobacco. He reports current alcohol use. He reports that he does not use drugs.  ? ?Family History:   family history includes Drug abuse in his brother; Early death in his brother; Heart attack in his father; Heart disease in his father.  ? ? ?Review of Systems: ?Review of Systems  ?Constitutional: Negative.   ?HENT: Negative.    ?Respiratory: Negative.    ?Cardiovascular:  Positive for chest pain.  ?Gastrointestinal: Negative.   ?Musculoskeletal: Negative.   ?Neurological: Negative.   ?Psychiatric/Behavioral: Negative.    ?  All other systems reviewed and are negative. ? ? ?PHYSICAL EXAM: ?VS:  BP 120/62 (BP Location: Left Arm, Patient Position: Sitting, Cuff Size: Normal)   Pulse 66   Ht 5' 6" (1.676 m)   Wt 174 lb 4 oz (79 kg)   SpO2 97%   BMI 28.12 kg/m?  , BMI Body mass index is 28.12 kg/m?. ?Constitutional:  oriented to person, place, and time. No distress.  ?HENT:  ?Head: Grossly normal ?Eyes:  no discharge. No scleral icterus.  ?Neck: No JVD, no  carotid bruits  ?Cardiovascular: Regular rate and rhythm, no murmurs appreciated ?Pulmonary/Chest: Clear to auscultation bilaterally, no wheezes or rails ?Abdominal: Soft.  no distension.  no tenderness.  ?Musculoskeletal: Normal range of motion ?Neurological:  normal muscle tone. Coordination normal. No atrophy ?Skin: Skin warm and dry ?Psychiatric: normal affect, pleasant ? ?Recent Labs: ?05/05/2021: BUN 20; Creatinine, Ser 1.26; Hemoglobin 14.3; Platelets 248; Potassium 4.5; Sodium 138  ? ? ?Lipid Panel ?Lab Results  ?Component Value Date  ? CHOL 237 (H) 05/08/2021  ? HDL 42.50 05/08/2021  ? LDLCALC 125 (H) 02/06/2020  ? TRIG 391.0 (H) 05/08/2021  ? ? ?Wt Readings from Last 3 Encounters:  ?05/14/21 174 lb 4 oz (79 kg)  ?05/09/21 177 lb (80.3 kg)  ?05/08/21 177 lb 6 oz (80.5 kg)  ?  ? ? ?ASSESSMENT AND PLAN: ? ?Problem List Items Addressed This Visit   ? ?  ? Cardiology Problems  ? CAD (coronary artery disease)  ? Relevant Orders  ? EKG 12-Lead  ? Hyperlipidemia  ? Relevant Orders  ? EKG 12-Lead  ? AAA (abdominal aortic aneurysm) (HCC) - Primary  ?  ? Other  ? Prediabetes  ? ?Other Visit Diagnoses   ? ? Angina pectoris (HCC)      ? Relevant Orders  ? EKG 12-Lead  ? ?  ?Angina ?Reports having recent episodes of chest pain, once was seen in the emergency room work-up negative ?Episode of chest pain yesterday lasting 1 hour ?Cardiac CTA with severe stenosis of proximal to mid RCA also with LAD disease ?Given anginal symptoms we have recommended cardiac catheterization ?Recommend he continue on his Crestor, start aspirin 81 mg daily ?Tentatively scheduled for tomorrow ?Labs have already been completed ?I have reviewed the risks, indications, and alternatives to cardiac catheterization, possible angioplasty, and stenting with the patient. Risks include but are not limited to bleeding, infection, vascular injury, stroke, myocardial infection, arrhythmia, kidney injury, radiation-related injury in the case of prolonged  fluoroscopy use, emergency cardiac surgery, and death. The patient understands the risks of serious complication is 1-2 in 1000 with diagnostic cardiac cath and 1-2% or less with angioplasty/stenting.  ? ? ?Hyperlipidemia ?Crestor 20 daily, has not been taking recently  ?numbers well above goal ? ? ? ? ? ? ?Signed, ?Tim Helios Kohlmann, M.D., Ph.D. ?[facility], [place] ?[phone] ?

## 2021-05-13 NOTE — Telephone Encounter (Signed)
-----   Message from Antonieta Iba, MD sent at 05/12/2021  5:22 PM EDT ----- ?Cardiac CTA ?Calcium score 950 ?Significant atherosclerosis in the proximal to mid right coronary artery causing severe stenosis ?Moderate plaque buildup in the proximal to mid LAD ?Report is indicated severe stenosis of the RCA and recommendation for cardiac catheterization ?Would recommend coming into office to review images and to discuss ?

## 2021-05-13 NOTE — Telephone Encounter (Signed)
Called patient, went over results and recommendations. Pt verbalized understanding, and questions, if any, were answered.  ? ?Pt already scheduled for tomorrow, 5/10 ?

## 2021-05-14 ENCOUNTER — Ambulatory Visit: Payer: Medicare PPO | Admitting: Cardiovascular Disease

## 2021-05-14 ENCOUNTER — Encounter: Payer: Self-pay | Admitting: Cardiovascular Disease

## 2021-05-14 VITALS — BP 120/62 | HR 66 | Ht 66.0 in | Wt 174.2 lb

## 2021-05-14 DIAGNOSIS — E782 Mixed hyperlipidemia: Secondary | ICD-10-CM | POA: Diagnosis not present

## 2021-05-14 DIAGNOSIS — R9431 Abnormal electrocardiogram [ECG] [EKG]: Secondary | ICD-10-CM

## 2021-05-14 DIAGNOSIS — R7303 Prediabetes: Secondary | ICD-10-CM

## 2021-05-14 DIAGNOSIS — I209 Angina pectoris, unspecified: Secondary | ICD-10-CM | POA: Diagnosis not present

## 2021-05-14 DIAGNOSIS — I25118 Atherosclerotic heart disease of native coronary artery with other forms of angina pectoris: Secondary | ICD-10-CM | POA: Diagnosis not present

## 2021-05-14 DIAGNOSIS — I714 Abdominal aortic aneurysm, without rupture, unspecified: Secondary | ICD-10-CM | POA: Diagnosis not present

## 2021-05-14 NOTE — Patient Instructions (Signed)
Medication Instructions:  ?Start aspirin 81 mg daily ? ?If you need a refill on your cardiac medications before your next appointment, please call your pharmacy.  ? ?Lab work: ?No new labs needed ? ?Testing/Procedures: ? ?You are scheduled for a Cardiac Catheterization on Thursday, May 11 with Dr. Bryan Lemma. ? ?1. Please arrive at the Medical Mall at [facility] at [address], [place], [place] [address] at 11:30 AM (This time is one hour before your procedure to ensure your preparation). Free valet parking service is available.  ? ?Special note: Every effort is made to have your procedure done on time. Please understand that emergencies sometimes delay scheduled procedures. ? ?2. Diet: Do not eat solid foods after midnight.  You may have clear liquids until 5 AM upon the day of the procedure. ? ?3. Labs: Drawn on 05/05/21 ? ?4. Medication instructions in preparation for your procedure: ? ? Contrast Allergy: No ? ? ?On the morning of your procedure, take Aspirin and any morning medicines NOT listed above.  You may use sips of water. ? ?5. Plan to go home the same day, you will only stay overnight if medically necessary. ?6. You MUST have a responsible adult to drive you home. ?7. An adult MUST be with you the first 24 hours after you arrive home. ?8. Bring a current list of your medications, and the last time and date medication taken. ?9. Bring ID and current insurance cards. ?10.Please wear clothes that are easy to get on and off and wear slip-on shoes. ? ?Thank you for allowing [place] to care for you! ?  -- [facility]  ? ?Follow-Up: ?At [facility], you and your health needs are our priority.  As part of our continuing mission to provide you with exceptional heart care, we have created designated Provider Care Teams.  These Care Teams include your primary Cardiologist (physician) and Advanced Practice Providers (APPs -  Physician Assistants and Nurse Practitioners) who all work  together to provide you with the care you need, when you need it. ? ?You will need a follow up appointment in 1 month ? ?Providers on your designated Care Team:   ?Nicolasa Ducking, NP ?Eula Listen, PA-C ?Cadence Fransico Michael, PA-C ? ?COVID-19 Vaccine Information can be found at: [email or web address].nl For questions related to vaccine distribution or appointments, please email [email or web address] or call [phone].  ? ?

## 2021-05-15 ENCOUNTER — Other Ambulatory Visit: Payer: Self-pay

## 2021-05-15 ENCOUNTER — Encounter: Payer: Self-pay | Admitting: Cardiology

## 2021-05-15 ENCOUNTER — Encounter
Admission: RE | Disposition: A | Discharge status: Home / Self Care | Payer: Self-pay | Source: Home / Self Care | Attending: Cardiology

## 2021-05-15 ENCOUNTER — Ambulatory Visit
Admission: RE | Admit: 2021-05-15 | Discharge: 2021-05-15 | Disposition: A | Discharge status: Home / Self Care | Payer: Medicare PPO | Attending: Cardiology | Admitting: Cardiology

## 2021-05-15 DIAGNOSIS — Z8616 Personal history of COVID-19: Secondary | ICD-10-CM | POA: Insufficient documentation

## 2021-05-15 DIAGNOSIS — K5190 Ulcerative colitis, unspecified, without complications: Secondary | ICD-10-CM | POA: Insufficient documentation

## 2021-05-15 DIAGNOSIS — R7303 Prediabetes: Secondary | ICD-10-CM | POA: Diagnosis not present

## 2021-05-15 DIAGNOSIS — I25118 Atherosclerotic heart disease of native coronary artery with other forms of angina pectoris: Secondary | ICD-10-CM | POA: Diagnosis not present

## 2021-05-15 DIAGNOSIS — R931 Abnormal findings on diagnostic imaging of heart and coronary circulation: Secondary | ICD-10-CM

## 2021-05-15 DIAGNOSIS — Z87891 Personal history of nicotine dependence: Secondary | ICD-10-CM | POA: Diagnosis not present

## 2021-05-15 DIAGNOSIS — I208 Other forms of angina pectoris: Secondary | ICD-10-CM

## 2021-05-15 DIAGNOSIS — E785 Hyperlipidemia, unspecified: Secondary | ICD-10-CM | POA: Insufficient documentation

## 2021-05-15 DIAGNOSIS — I2089 Other forms of angina pectoris: Secondary | ICD-10-CM

## 2021-05-15 DIAGNOSIS — I2582 Chronic total occlusion of coronary artery: Secondary | ICD-10-CM | POA: Diagnosis not present

## 2021-05-15 DIAGNOSIS — I714 Abdominal aortic aneurysm, without rupture, unspecified: Secondary | ICD-10-CM | POA: Insufficient documentation

## 2021-05-15 HISTORY — DX: Other specified postprocedural states: Z98.890

## 2021-05-15 HISTORY — PX: LEFT HEART CATH AND CORONARY ANGIOGRAPHY: CATH118249

## 2021-05-15 SURGERY — LEFT HEART CATH AND CORONARY ANGIOGRAPHY
Anesthesia: Moderate Sedation

## 2021-05-15 MED ORDER — SODIUM CHLORIDE 0.9% FLUSH: 3.0000 mL | Freq: Two times a day (BID) | INTRAVENOUS | Status: DC

## 2021-05-15 MED ORDER — SODIUM CHLORIDE 0.9 % WEIGHT BASED INFUSION
1.0000 mL/kg/h | INTRAVENOUS | Status: DC
  Administered 2021-05-15: 1 mL/kg/h via INTRAVENOUS

## 2021-05-15 MED ORDER — HYDRALAZINE HCL 20 MG/ML IJ SOLN: 10.0000 mg | INTRAMUSCULAR | Status: DC | PRN

## 2021-05-15 MED ORDER — ACETAMINOPHEN 325 MG PO TABS: 650.0000 mg | ORAL_TABLET | ORAL | Status: DC | PRN

## 2021-05-15 MED ORDER — HEPARIN SODIUM (PORCINE) 1000 UNIT/ML IJ SOLN
INTRAMUSCULAR | Status: AC
  Filled 2021-05-15: qty 10

## 2021-05-15 MED ORDER — ASPIRIN EC 81 MG PO TBEC: 81.0000 mg | DELAYED_RELEASE_TABLET | Freq: Every day | ORAL | 0 refills | Status: AC

## 2021-05-15 MED ORDER — LABETALOL HCL 5 MG/ML IV SOLN: 10.0000 mg | INTRAVENOUS | Status: DC | PRN

## 2021-05-15 MED ORDER — IOHEXOL 300 MG/ML  SOLN
INTRAMUSCULAR | Status: DC | PRN
Start: 2021-05-15 — End: 2021-05-15
  Administered 2021-05-15: 72 mL

## 2021-05-15 MED ORDER — SODIUM CHLORIDE 0.9% FLUSH: 3.0000 mL | INTRAVENOUS | Status: DC | PRN

## 2021-05-15 MED ORDER — HEPARIN (PORCINE) IN NACL 1000-0.9 UT/500ML-% IV SOLN
INTRAVENOUS | Status: AC
  Filled 2021-05-15: qty 1000

## 2021-05-15 MED ORDER — SODIUM CHLORIDE 0.9 % WEIGHT BASED INFUSION
3.0000 mL/kg/h | INTRAVENOUS | Status: AC
  Administered 2021-05-15: 3 mL/kg/h via INTRAVENOUS

## 2021-05-15 MED ORDER — SODIUM CHLORIDE 0.9 % IV SOLN: 250.0000 mL | INTRAVENOUS | Status: DC | PRN

## 2021-05-15 MED ORDER — SODIUM CHLORIDE 0.9 % WEIGHT BASED INFUSION: 1.0000 mL/kg/h | INTRAVENOUS | Status: DC

## 2021-05-15 MED ORDER — ONDANSETRON HCL 4 MG/2ML IJ SOLN: 4.0000 mg | Freq: Four times a day (QID) | INTRAMUSCULAR | Status: DC | PRN

## 2021-05-15 MED ORDER — MIDAZOLAM HCL 2 MG/2ML IJ SOLN
INTRAMUSCULAR | Status: AC
  Filled 2021-05-15: qty 2

## 2021-05-15 MED ORDER — LIDOCAINE HCL (PF) 1 % IJ SOLN
INTRAMUSCULAR | Status: DC | PRN
  Administered 2021-05-15: 2 mL

## 2021-05-15 MED ORDER — ASPIRIN 81 MG PO CHEW: 81.0000 mg | CHEWABLE_TABLET | ORAL | Status: DC

## 2021-05-15 MED ORDER — HEPARIN SODIUM (PORCINE) 1000 UNIT/ML IJ SOLN
INTRAMUSCULAR | Status: DC | PRN
Start: 2021-05-15 — End: 2021-05-15
  Administered 2021-05-15: 4000 [IU] via INTRAVENOUS

## 2021-05-15 MED ORDER — LIDOCAINE HCL 1 % IJ SOLN
INTRAMUSCULAR | Status: AC
  Filled 2021-05-15: qty 20

## 2021-05-15 MED ORDER — FENTANYL CITRATE (PF) 100 MCG/2ML IJ SOLN
INTRAMUSCULAR | Status: AC
  Filled 2021-05-15: qty 2

## 2021-05-15 MED ORDER — MIDAZOLAM HCL 2 MG/2ML IJ SOLN
INTRAMUSCULAR | Status: DC | PRN
Start: 2021-05-15 — End: 2021-05-15
  Administered 2021-05-15 (×2): 1 mg via INTRAVENOUS

## 2021-05-15 MED ORDER — VERAPAMIL HCL 2.5 MG/ML IV SOLN
INTRAVENOUS | Status: AC
  Filled 2021-05-15: qty 2

## 2021-05-15 MED ORDER — VERAPAMIL HCL 2.5 MG/ML IV SOLN
INTRAVENOUS | Status: DC | PRN
  Administered 2021-05-15: 2.5 mg via INTRA_ARTERIAL

## 2021-05-15 MED ORDER — FENTANYL CITRATE (PF) 100 MCG/2ML IJ SOLN
INTRAMUSCULAR | Status: DC | PRN
  Administered 2021-05-15 (×2): 25 ug via INTRAVENOUS

## 2021-05-15 MED ORDER — METOPROLOL SUCCINATE ER 25 MG PO TB24: 12.5000 mg | ORAL_TABLET | Freq: Every day | ORAL | 11 refills | Status: DC

## 2021-05-15 SURGICAL SUPPLY — 10 items
CATH 5F 110X4 TIG (CATHETERS) ×1 IMPLANT
DEVICE RAD TR BAND REGULAR (VASCULAR PRODUCTS) ×1 IMPLANT
DRAPE BRACHIAL (DRAPES) ×1 IMPLANT
GLIDESHEATH SLEND SS 6F .021 (SHEATH) ×1 IMPLANT
GUIDEWIRE INQWIRE 1.5J.035X260 (WIRE) IMPLANT
INQWIRE 1.5J .035X260CM (WIRE) ×2
PACK CARDIAC CATH (CUSTOM PROCEDURE TRAY) ×2 IMPLANT
PROTECTION STATION PRESSURIZED (MISCELLANEOUS) ×2
SET ATX SIMPLICITY (MISCELLANEOUS) ×1 IMPLANT
STATION PROTECTION PRESSURIZED (MISCELLANEOUS) IMPLANT

## 2021-05-15 NOTE — Interval H&P Note (Signed)
History and Physical Interval Note: ? ?05/15/2021 ?12:54 PM ? ?Gregory Obrien  has presented today for surgery, with the diagnosis of LT Heart Cath   Stable angina.  The various methods of treatment have been discussed with the patient and family. After consideration of risks, benefits and other options for treatment, the patient has consented to  Procedure(s): ?LEFT HEART CATH AND CORONARY ANGIOGRAPHY (N/A)  ?PERCUTANEOUS CORONARY INTERVENTION ? ? ?as a surgical intervention.  The patient's history has been reviewed, patient examined, no change in status, stable for surgery.  I have reviewed the patient's chart and labs.  Questions were answered to the patient's satisfaction.   ? ?Cath Lab Visit (complete for each Cath Lab visit) ? ?Clinical Evaluation Leading to the Procedure:  ? ?ACS: No. ? ?Non-ACS:   ? ?Anginal Classification: CCS III ? ?Anti-ischemic medical therapy: Minimal Therapy (1 class of medications) ? ?Non-Invasive Test Results: High-risk stress test findings: cardiac mortality >3%/year ? ?Prior CABG: No previous CABG ? ? ?Gregory Obrien ? ? ?

## 2021-05-16 ENCOUNTER — Encounter: Payer: Self-pay | Admitting: Cardiology

## 2021-06-16 NOTE — Progress Notes (Unsigned)
Cardiology Office Note  Date:  06/18/2021   ID:  [name], DOB 05-15-1950, MRN [id number]  PCP:  Judy Pimple, MD   Chief Complaint  Patient presents with   1 month follow up     Discuss recent cardiac cath. Patient c/o shortness of breath and chest pain. Medications reviewed by the patient verbally.     HPI:  [name] is a 71 year old gentleman with past medical history of Former smoker 10 years Hyperlipidemia COVID in January 2023 Ulcerative colitis Abdominal aortic aneurysm followed by vascular, 3.2 cm Seen in the emergency room May 05, 2021 for chest pain Who presents today for follow-up of his coronary artery disease, angina  Last seen by myself in clinic May 2023 Was referred for cardiac catheterization given severe stenosis on cardiac CTA  Cardiac catheterization May 15, 2021, results as below   Prox RCA lesion is 100% stenosed.   The mid to distal RCA fills via collaterals from the left system: Mid RCA-1 lesion is 95% stenosed. Mid RCA-2 lesion is 80% stenosed.  Mid LAD lesion is 40% stenosed.   Ost LAD lesion is 40% stenosed.   The left ventricular systolic function is normal.  The left ventricular ejection fraction is 55-65% by visual estimate.   LV end diastolic pressure is normal.   There is no aortic valve stenosis.   SUMMARY Severe Single-Vessel CAD with 100% CTO with a proximal RCA with brisk collaterals filling via AVG LCx, OM1 and distal LAD Mild to moderate ostial and proximal LAD-not flow-limiting.   Normal LVEF and normal EDP.   RECOMMENDATIONS Aggressive risk factor modification and medical management of angina. If symptoms persist, would consider ischemia/viability evaluation of the RCA territory, may be a candidate for CTO PCI of the RCA.  (Has brisk lateral to go almost all the way back to the occlusion)  On discussion of his symptoms on today's visit, reports feeling Tired, heavy breathing, some chest pain at times No regular  exercise program, somewhat sedentary Interested in learning his options based on the coronary anatomy as detailed above  EKG personally reviewed by myself on todays visit Normal sinus rhythm rate 61 bpm low voltage, right bundle branch block  Other past medical history reviewed Cardiac CTA performed May 12 2021, images pulled up and reviewed  results discussed in detail, images pulled up and reviewed 1. Coronary calcium score of 954. This was 85th percentile for age and sex matched control. 2. Normal coronary origin with right dominance. 3. Calcified plaque in the proximal to mid RCA causing severe stenosis (>70%). 4. Calcified plaque in the proximal to mid LAD causing moderate stenosis (50-69%). 5. CAD-RADS 4 Severe stenosis. Cardiac catheterization is recommended.  Prior records reviewed  Seen in the emergency room May 05, 2021, had a sandwich from [employer] shortly after developed pain radiating through to his back Enzymes negative D-dimer normal, low risk for PE Pain left of sternum,  Total cholesterol greater than 230, has been off the Crestor A1c 6.2, prediabetic range  Father with pacer   PMH:   has a past medical history of Blood in stool, H/O arthroscopy of knee, History of colon polyps, UC (ulcerative colitis) (HCC), and Ulcerative colitis (HCC).  PSH:    Past Surgical History:  Procedure Laterality Date   BACK SURGERY     COLONOSCOPY WITH PROPOFOL N/A 11/25/2017   Procedure: COLONOSCOPY WITH PROPOFOL;  Surgeon: Toney Reil, MD;  Location: [facility] ENDOSCOPY;  Service: Gastroenterology;  Laterality: N/A;  KNEE SURGERY     LEFT HEART CATH AND CORONARY ANGIOGRAPHY N/A 05/15/2021   Procedure: LEFT HEART CATH AND CORONARY ANGIOGRAPHY;  Surgeon: Marykay Lex, MD;  Location: [facility] INVASIVE CV LAB;  Service: Cardiovascular;  Laterality: N/A;    Current Outpatient Medications  Medication Sig Dispense Refill   aspirin EC 81 MG tablet Take 1 tablet (81 mg total) by  mouth daily. Swallow whole. 360 tablet 0   cetirizine (ZYRTEC) 10 MG tablet Take 5 mg by mouth every other day.     famotidine (PEPCID) 20 MG tablet Take 1 tablet (20 mg total) by mouth 2 (two) times daily. 60 tablet 5   metoprolol succinate (TOPROL XL) 25 MG 24 hr tablet Take 0.5 tablets (12.5 mg total) by mouth daily. 15 tablet 11   rosuvastatin (CRESTOR) 20 MG tablet Take 1 tablet (20 mg total) by mouth daily. 90 tablet 3   sildenafil (REVATIO) 20 MG tablet 2-5 tabs 1 hour prior to intercourse 50 tablet 11   No current facility-administered medications for this visit.   Allergies:   Infliximab   Social History:  The patient  reports that he quit smoking about 30 years ago. His smoking use included cigarettes. He has a 10.00 pack-year smoking history. He has never used smokeless tobacco. He reports current alcohol use of about 5.0 standard drinks of alcohol per week. He reports that he does not use drugs.   Family History:   family history includes Drug abuse in his brother; Early death in his brother; Heart attack in his father; Heart disease in his father.    Review of Systems: Review of Systems  Constitutional: Negative.   HENT: Negative.    Respiratory: Negative.    Cardiovascular:  Positive for chest pain.  Gastrointestinal: Negative.   Musculoskeletal: Negative.   Neurological: Negative.   Psychiatric/Behavioral: Negative.    All other systems reviewed and are negative.    PHYSICAL EXAM: VS:  BP 130/70 (BP Location: Left Arm, Patient Position: Sitting, Cuff Size: Normal)   Pulse 61   Ht 5\' 6"  (1.676 m)   Wt 175 lb 4 oz (79.5 kg)   SpO2 97%   BMI 28.29 kg/m  , BMI Body mass index is 28.29 kg/m. Constitutional:  oriented to person, place, and time. No distress.  HENT:  Head: Grossly normal Eyes:  no discharge. No scleral icterus.  Neck: No JVD, no carotid bruits  Cardiovascular: Regular rate and rhythm, no murmurs appreciated Pulmonary/Chest: Clear to auscultation  bilaterally, no wheezes or rails Abdominal: Soft.  no distension.  no tenderness.  Musculoskeletal: Normal range of motion Neurological:  normal muscle tone. Coordination normal. No atrophy Skin: Skin warm and dry Psychiatric: normal affect, pleasant  Recent Labs: 05/05/2021: BUN 20; Creatinine, Ser 1.26; Hemoglobin 14.3; Platelets 248; Potassium 4.5; Sodium 138    Lipid Panel Lab Results  Component Value Date   CHOL 237 (H) 05/08/2021   HDL 42.50 05/08/2021   LDLCALC 125 (H) 02/06/2020   TRIG 391.0 (H) 05/08/2021    Wt Readings from Last 3 Encounters:  06/18/21 175 lb 4 oz (79.5 kg)  05/15/21 173 lb (78.5 kg)  05/14/21 174 lb 4 oz (79 kg)      ASSESSMENT AND PLAN:  Problem List Items Addressed This Visit       Cardiology Problems   Stable angina (HCC)   Relevant Orders   EKG 12-Lead   CAD (coronary artery disease) - Primary   Relevant Orders   EKG 12-Lead  Hyperlipidemia   AAA (abdominal aortic aneurysm) (HCC)     Other   Prediabetes   Relevant Orders   EKG 12-Lead   Other Visit Diagnoses     Chest pain of uncertain etiology          Chronic stable angina Occluded RCA with collaterals from left to right Reports able to ambulate/exercise for 10 minutes then seems to give out, some occasional chest discomfort, tired, heavier breathing We have recommended cardiac rehab for chronic stable angina symptoms We will start Imdur 30 mg daily Recommend we pursue this course of action, though if he has worsening anginal symptoms in the next several months, could consider referral [facility] for [facility] of CTO management   Hyperlipidemia Crestor 20 daily, has not been taking recently  numbers well above goal Reports he has recheck of lipids in 1 week May need to add Zetia  Essential hypertension Addition of isosorbide as above for chronic stable angina symptoms  Long discussion concerning recent cardiac catheterization  results, management  Total encounter time more than 40 minutes  Greater than 50% was spent in counseling and coordination of care with the patient     Signed, Dossie Arbourim Lakeysha Slutsky, M.D., Ph.D. [facility], [place] [phone]

## 2021-06-18 ENCOUNTER — Ambulatory Visit: Payer: Medicare PPO | Admitting: Cardiovascular Disease

## 2021-06-18 ENCOUNTER — Encounter: Payer: Self-pay | Admitting: Cardiovascular Disease

## 2021-06-18 VITALS — BP 130/70 | HR 61 | Ht 66.0 in | Wt 175.2 lb

## 2021-06-18 DIAGNOSIS — R7303 Prediabetes: Secondary | ICD-10-CM | POA: Diagnosis not present

## 2021-06-18 DIAGNOSIS — I10 Essential (primary) hypertension: Secondary | ICD-10-CM

## 2021-06-18 DIAGNOSIS — E785 Hyperlipidemia, unspecified: Secondary | ICD-10-CM

## 2021-06-18 DIAGNOSIS — I25118 Atherosclerotic heart disease of native coronary artery with other forms of angina pectoris: Secondary | ICD-10-CM | POA: Diagnosis not present

## 2021-06-18 DIAGNOSIS — R079 Chest pain, unspecified: Secondary | ICD-10-CM | POA: Diagnosis not present

## 2021-06-18 DIAGNOSIS — R9431 Abnormal electrocardiogram [ECG] [EKG]: Secondary | ICD-10-CM

## 2021-06-18 DIAGNOSIS — I714 Abdominal aortic aneurysm, without rupture, unspecified: Secondary | ICD-10-CM | POA: Diagnosis not present

## 2021-06-18 DIAGNOSIS — Z9889 Other specified postprocedural states: Secondary | ICD-10-CM

## 2021-06-18 DIAGNOSIS — E782 Mixed hyperlipidemia: Secondary | ICD-10-CM

## 2021-06-18 DIAGNOSIS — I208 Other forms of angina pectoris: Secondary | ICD-10-CM

## 2021-06-18 MED ORDER — ISOSORBIDE MONONITRATE ER 30 MG PO TB24: 30.0000 mg | ORAL_TABLET | Freq: Every day | ORAL | 3 refills | Status: DC

## 2021-06-18 NOTE — Patient Instructions (Addendum)
Cardiac rehab for chronic stable angina  Goal total chol <140, LDL <55 If numbers elevated, call the office  Medication Instructions:   Please start isosorbide 30 mg daily  If you need a refill on your cardiac medications before your next appointment, please call your pharmacy.   Lab work: No new labs needed  Testing/Procedures: No new testing needed  Follow-Up: At [facility], you and your health needs are our priority.  As part of our continuing mission to provide you with exceptional heart care, we have created designated Provider Care Teams.  These Care Teams include your primary Cardiologist (physician) and Advanced Practice Providers (APPs -  Physician Assistants and Nurse Practitioners) who all work together to provide you with the care you need, when you need it.  You will need a follow up appointment in 6 months  Providers on your designated Care Team:   Nicolasa Ducking, NP Eula Listen, PA-C Cadence Fransico Michael, [place]  COVID-19 Vaccine Information can be found at: [email or web address].nl For questions related to vaccine distribution or appointments, please email [email or web address] or call [phone].

## 2021-06-23 ENCOUNTER — Telehealth: Payer: Self-pay | Admitting: Family Medicine

## 2021-06-23 DIAGNOSIS — E782 Mixed hyperlipidemia: Secondary | ICD-10-CM

## 2021-06-23 NOTE — Telephone Encounter (Signed)
-----   Message from Alvina Chou sent at 06/09/2021 12:26 PM EDT ----- Regarding: Lab orders for Tuesday, 6.20.23 Lab orders, no f/u

## 2021-06-24 ENCOUNTER — Encounter: Payer: Self-pay | Admitting: Family Medicine

## 2021-06-24 ENCOUNTER — Other Ambulatory Visit (INDEPENDENT_AMBULATORY_CARE_PROVIDER_SITE_OTHER): Payer: Medicare PPO

## 2021-06-24 ENCOUNTER — Encounter: Payer: Self-pay | Admitting: Cardiovascular Disease

## 2021-06-24 DIAGNOSIS — E782 Mixed hyperlipidemia: Secondary | ICD-10-CM | POA: Diagnosis not present

## 2021-06-24 LAB — LIPID PANEL
Cholesterol: 192 mg/dL (ref 0–200)
HDL: 37.9 mg/dL — ABNORMAL LOW (ref >39.00)
NonHDL: 154.07
Total CHOL/HDL Ratio: 5
Triglycerides: 271 mg/dL — ABNORMAL HIGH (ref 0.0–149.0)
VLDL: 54.2 mg/dL — ABNORMAL HIGH (ref 0.0–40.0)

## 2021-06-24 LAB — LDL CHOLESTEROL, DIRECT: Direct LDL: 114 mg/dL

## 2021-06-24 LAB — AST: AST: 21 U/L (ref 0–37)

## 2021-06-24 LAB — ALT: ALT: 18 U/L (ref 0–53)

## 2021-06-25 ENCOUNTER — Telehealth: Payer: Self-pay

## 2021-06-25 NOTE — Telephone Encounter (Signed)
-----   Message from Judy Pimple, MD sent at 06/24/2021  5:10 PM EDT ----- Your cholesterol is improved  (LDL is down from 169 to 114)  The goal is to get it below 70 if possible  If I am correct you have not been on the crestor very long ?  If so this may improve more  Will forward to your cardiologist for further advisement

## 2021-06-25 NOTE — Telephone Encounter (Signed)
Called and lvm for patient to call us back regarding his results.

## 2021-06-26 ENCOUNTER — Encounter: Payer: Medicare PPO | Attending: Cardiovascular Disease | Admitting: *Deleted

## 2021-06-26 DIAGNOSIS — I208 Other forms of angina pectoris: Secondary | ICD-10-CM

## 2021-06-26 NOTE — Progress Notes (Signed)
Virtual orientation call completed today. he has an appointment on Date: 07/07/2021  for EP eval and gym Orientation.  Documentation of diagnosis can be found in [facility]  Date: 06/18/2021 .

## 2021-06-27 ENCOUNTER — Telehealth: Payer: Self-pay | Admitting: Emergency Medicine

## 2021-06-27 DIAGNOSIS — E782 Mixed hyperlipidemia: Secondary | ICD-10-CM

## 2021-06-27 MED ORDER — EZETIMIBE 10 MG PO TABS: 10.0000 mg | ORAL_TABLET | Freq: Every day | ORAL | 3 refills | Status: DC

## 2021-06-27 MED ORDER — ROSUVASTATIN CALCIUM 40 MG PO TABS: 40.0000 mg | ORAL_TABLET | Freq: Every day | ORAL | 3 refills | Status: DC

## 2021-07-07 ENCOUNTER — Encounter: Payer: Medicare PPO | Attending: Cardiovascular Disease | Admitting: *Deleted

## 2021-07-07 VITALS — Ht 67.1 in | Wt 172.7 lb

## 2021-07-07 DIAGNOSIS — I208 Other forms of angina pectoris: Secondary | ICD-10-CM | POA: Insufficient documentation

## 2021-07-07 NOTE — Progress Notes (Signed)
Cardiac Individual Treatment Plan  Patient Details  Name: Gregory Obrien MRN: [id number] Date of Birth: January 11, 1950 Referring Provider:   Flowsheet Row Cardiac Rehab from 07/07/2021 in [address] [facility] Cardiac and Pulmonary Rehab  Referring Provider [name] MD       Initial Encounter Date:  Flowsheet Row Cardiac Rehab from 07/07/2021 in [facility] Cardiac and Pulmonary Rehab  Date 07/07/21       Visit Diagnosis: No diagnosis found.  Patient's Home Medications on Admission:  Current Outpatient Medications:    aspirin EC 81 MG tablet, Take 1 tablet (81 mg total) by mouth daily. Swallow whole., Disp: 360 tablet, Rfl: 0   cetirizine (ZYRTEC) 10 MG tablet, Take 5 mg by mouth every other day., Disp: , Rfl:    ezetimibe (ZETIA) 10 MG tablet, Take 1 tablet (10 mg total) by mouth daily., Disp: 90 tablet, Rfl: 3   famotidine (PEPCID) 20 MG tablet, Take 1 tablet (20 mg total) by mouth 2 (two) times daily., Disp: 60 tablet, Rfl: 5   isosorbide mononitrate (IMDUR) 30 MG 24 hr tablet, Take 1 tablet (30 mg total) by mouth daily., Disp: 30 tablet, Rfl: 3   metoprolol succinate (TOPROL XL) 25 MG 24 hr tablet, Take 0.5 tablets (12.5 mg total) by mouth daily., Disp: 15 tablet, Rfl: 11   rosuvastatin (CRESTOR) 40 MG tablet, Take 1 tablet (40 mg total) by mouth daily., Disp: 90 tablet, Rfl: 3   sildenafil (REVATIO) 20 MG tablet, 2-5 tabs 1 hour prior to intercourse, Disp: 50 tablet, Rfl: 11  Past Medical History: Past Medical History:  Diagnosis Date   Blood in stool    H/O arthroscopy of knee    History of colon polyps    UC (ulcerative colitis) ([place])    Ulcerative colitis ([place])    remission since 2017, see Dr. Willow Ora    Tobacco Use: Social History   Tobacco Use  Smoking Status Former   Packs/day: 1.00   Years: 10.00   Total pack years: 10.00   Types: Cigarettes   Quit date: 12/07/1990   Years since quitting: 30.6  Smokeless Tobacco Never    Labs: Review Flowsheet  More data exists       Latest Ref Rng & Units 09/24/2017 10/28/2018 02/06/2020 05/08/2021  Labs for ITP Cardiac and Pulmonary Rehab  Cholestrol 0 - 200 mg/dL 217  286  201  237   LDL (calc) 0 - 99 mg/dL 144  204  125  -  Direct LDL mg/dL - - - 169.0   HDL-C >39.00 mg/dL 36.70  48.20  42.30  42.50   Trlycerides 0.0 - 149.0 mg/dL 183.0  171.0  168.0  391.0   Hemoglobin A1c 4.6 - 6.5 % 6.1  6.1  6.2  -      06/24/2021  Labs for ITP Cardiac and Pulmonary Rehab  Cholestrol 192   LDL (calc) -  Direct LDL 114.0   HDL-C 37.90   Trlycerides 271.0   Hemoglobin A1c -     Exercise Target Goals: Exercise Program Goal: Individual exercise prescription set using results from initial 6 min walk test and THRR while considering  patient's activity barriers and safety.   Exercise Prescription Goal: Initial exercise prescription builds to 30-45 minutes a day of aerobic activity, 2-3 days per week.  Home exercise guidelines will be given to patient during program as part of exercise prescription that the participant will acknowledge.   Education: Aerobic Exercise: - Group verbal and visual presentation on the components of exercise  prescription. Introduces F.I.T.T principle from ACSM for exercise prescriptions.  Reviews F.I.T.T. principles of aerobic exercise including progression. Written material given at graduation.   Education: Resistance Exercise: - Group verbal and visual presentation on the components of exercise prescription. Introduces F.I.T.T principle from ACSM for exercise prescriptions  Reviews F.I.T.T. principles of resistance exercise including progression. Written material given at graduation.    Education: Exercise & Equipment Safety: - Individual verbal instruction and demonstration of equipment use and safety with use of the equipment. Flowsheet Row Cardiac Rehab from 07/07/2021 in [facility] Cardiac and Pulmonary Rehab  Date 07/07/21  Educator [facility]  Instruction Review Code 1- Verbalizes Understanding        Education: Exercise Physiology & General Exercise Guidelines: - Group verbal and written instruction with models to review the exercise physiology of the cardiovascular system and associated critical values. Provides general exercise guidelines with specific guidelines to those with heart or lung disease.    Education: Flexibility, Balance, Mind/Body Relaxation: - Group verbal and visual presentation with interactive activity on the components of exercise prescription. Introduces F.I.T.T principle from ACSM for exercise prescriptions. Reviews F.I.T.T. principles of flexibility and balance exercise training including progression. Also discusses the mind body connection.  Reviews various relaxation techniques to help reduce and manage stress (i.e. Deep breathing, progressive muscle relaxation, and visualization). Balance handout provided to take home. Written material given at graduation.   Activity Barriers & Risk Stratification:  Activity Barriers & Cardiac Risk Stratification - 07/07/21 1138       Activity Barriers & Cardiac Risk Stratification   Activity Barriers Back Problems;Deconditioning;Muscular Weakness;Shortness of Breath;Joint Problems;Chest Pain/Angina   back surgery, bilateral knee arthoscopy   Cardiac Risk Stratification Moderate             6 Minute Walk:  6 Minute Walk     Row Name 07/07/21 1137         6 Minute Walk   Phase Initial     Distance 1270 feet     Walk Time 6 minutes     # of Rest Breaks 0     MPH 2.41     METS 2.74     RPE 7     Perceived Dyspnea  1     VO2 Peak 9.57     Symptoms Yes (comment)     Comments SOB, hips sore wiht walking 1/10, no chest pain     Resting HR 60 bpm     Resting BP 116/52     Resting Oxygen Saturation  94 %     Exercise Oxygen Saturation  during 6 min walk 94 %     Max Ex. HR 83 bpm     Max Ex. BP 126/70     2 Minute Post BP 116/62              Oxygen Initial Assessment:   Oxygen  Re-Evaluation:   Oxygen Discharge (Final Oxygen Re-Evaluation):   Initial Exercise Prescription:  Initial Exercise Prescription - 07/07/21 1100       Date of Initial Exercise RX and Referring Provider   Date 07/07/21    Referring Provider [name] MD      Oxygen   Maintain Oxygen Saturation 88% or higher      Treadmill   MPH 2.3    Grade 0.5    Minutes 15    METs 2.92      Elliptical   Level 1    Speed 2.5    Minutes  15    METs 2      REL-XR   Level 1    Speed 50    Minutes 15    METs 2.5      Prescription Details   Frequency (times per week) 2    Duration Progress to 30 minutes of continuous aerobic without signs/symptoms of physical distress      Intensity   THRR 40-80% of Max Heartrate 96-133    Ratings of Perceived Exertion 11-13    Perceived Dyspnea 0-4      Progression   Progression Continue to progress workloads to maintain intensity without signs/symptoms of physical distress.      Resistance Training   Training Prescription Yes    Weight 4 lb    Reps 10-15             Perform Capillary Blood Glucose checks as needed.  Exercise Prescription Changes:   Exercise Prescription Changes     Row Name 07/07/21 1100             Response to Exercise   Blood Pressure (Admit) 116/52       Blood Pressure (Exercise) 126/70       Blood Pressure (Exit) 116/62       Heart Rate (Admit) 60 bpm       Heart Rate (Exercise) 83 bpm       Heart Rate (Exit) 58 bpm       Oxygen Saturation (Admit) 94 %       Oxygen Saturation (Exercise) 94 %       Rating of Perceived Exertion (Exercise) 7       Perceived Dyspnea (Exercise) 1       Symptoms SOB, hips sore 1/10       Comments walk test results                Exercise Comments:   Exercise Goals and Review:   Exercise Goals     Row Name 07/07/21 1140             Exercise Goals   Increase Physical Activity Yes       Intervention Provide advice, education, support and counseling  about physical activity/exercise needs.;Develop an individualized exercise prescription for aerobic and resistive training based on initial evaluation findings, risk stratification, comorbidities and participant's personal goals.       Expected Outcomes Short Term: Attend rehab on a regular basis to increase amount of physical activity.;Long Term: Exercising regularly at least 3-5 days a week.;Long Term: Add in home exercise to make exercise part of routine and to increase amount of physical activity.       Increase Strength and Stamina Yes       Intervention Provide advice, education, support and counseling about physical activity/exercise needs.;Develop an individualized exercise prescription for aerobic and resistive training based on initial evaluation findings, risk stratification, comorbidities and participant's personal goals.       Expected Outcomes Short Term: Perform resistance training exercises routinely during rehab and add in resistance training at home;Short Term: Increase workloads from initial exercise prescription for resistance, speed, and METs.;Long Term: Improve cardiorespiratory fitness, muscular endurance and strength as measured by increased METs and functional capacity (6MWT)       Able to understand and use rate of perceived exertion (RPE) scale Yes       Intervention Provide education and explanation on how to use RPE scale       Expected Outcomes Short Term: Able to  use RPE daily in rehab to express subjective intensity level;Long Term:  Able to use RPE to guide intensity level when exercising independently       Able to understand and use Dyspnea scale Yes       Intervention Provide education and explanation on how to use Dyspnea scale       Expected Outcomes Short Term: Able to use Dyspnea scale daily in rehab to express subjective sense of shortness of breath during exertion;Long Term: Able to use Dyspnea scale to guide intensity level when exercising independently        Knowledge and understanding of Target Heart Rate Range (THRR) Yes       Intervention Provide education and explanation of THRR including how the numbers were predicted and where they are located for reference       Expected Outcomes Short Term: Able to use daily as guideline for intensity in rehab;Short Term: Able to state/look up THRR;Long Term: Able to use THRR to govern intensity when exercising independently       Able to check pulse independently Yes       Intervention Provide education and demonstration on how to check pulse in carotid and radial arteries.;Review the importance of being able to check your own pulse for safety during independent exercise       Expected Outcomes Short Term: Able to explain why pulse checking is important during independent exercise;Long Term: Able to check pulse independently and accurately       Understanding of Exercise Prescription Yes       Intervention Provide education, explanation, and written materials on patient's individual exercise prescription       Expected Outcomes Short Term: Able to explain program exercise prescription;Long Term: Able to explain home exercise prescription to exercise independently                Exercise Goals Re-Evaluation :   Discharge Exercise Prescription (Final Exercise Prescription Changes):  Exercise Prescription Changes - 07/07/21 1100       Response to Exercise   Blood Pressure (Admit) 116/52    Blood Pressure (Exercise) 126/70    Blood Pressure (Exit) 116/62    Heart Rate (Admit) 60 bpm    Heart Rate (Exercise) 83 bpm    Heart Rate (Exit) 58 bpm    Oxygen Saturation (Admit) 94 %    Oxygen Saturation (Exercise) 94 %    Rating of Perceived Exertion (Exercise) 7    Perceived Dyspnea (Exercise) 1    Symptoms SOB, hips sore 1/10    Comments walk test results             Nutrition:  Target Goals: Understanding of nutrition guidelines, daily intake of sodium 1500mg , cholesterol 200mg , calories 30%  from fat and 7% or less from saturated fats, daily to have 5 or more servings of fruits and vegetables.  Education: All About Nutrition: -Group instruction provided by verbal, written material, interactive activities, discussions, models, and posters to present general guidelines for heart healthy nutrition including fat, fiber, MyPlate, the role of sodium in heart healthy nutrition, utilization of the nutrition label, and utilization of this knowledge for meal planning. Follow up email sent as well. Written material given at graduation. Flowsheet Row Cardiac Rehab from 07/07/2021 in [facility] Cardiac and Pulmonary Rehab  Education need identified 07/07/21       Biometrics:  Pre Biometrics - 07/07/21 1140       Pre Biometrics   Height 5' 7.1" (1.704 m)  Weight 172 lb 11.2 oz (78.3 kg)    BMI (Calculated) 26.98    Single Leg Stand 23.5 seconds              Nutrition Therapy Plan and Nutrition Goals:  Nutrition Therapy & Goals - 07/07/21 1143       Intervention Plan   Intervention Prescribe, educate and counsel regarding individualized specific dietary modifications aiming towards targeted core components such as weight, hypertension, lipid management, diabetes, heart failure and other comorbidities.    Expected Outcomes Short Term Goal: Understand basic principles of dietary content, such as calories, fat, sodium, cholesterol and nutrients.;Short Term Goal: A plan has been developed with personal nutrition goals set during dietitian appointment.;Long Term Goal: Adherence to prescribed nutrition plan.             Nutrition Assessments:  MEDIFICTS Score Key: ?70 Need to make dietary changes  40-70 Heart Healthy Diet ? 40 Therapeutic Level Cholesterol Diet   Picture Your Plate Scores: [id number] Unhealthy dietary pattern with much room for improvement. 41-50 Dietary pattern unlikely to meet recommendations for good health and room for improvement. 51-60 More healthful dietary  pattern, with some room for improvement.  >60 Healthy dietary pattern, although there may be some specific behaviors that could be improved.    Nutrition Goals Re-Evaluation:   Nutrition Goals Discharge (Final Nutrition Goals Re-Evaluation):   Psychosocial: Target Goals: Acknowledge presence or absence of significant depression and/or stress, maximize coping skills, provide positive support system. Participant is able to verbalize types and ability to use techniques and skills needed for reducing stress and depression.   Education: Stress, Anxiety, and Depression - Group verbal and visual presentation to define topics covered.  Reviews how body is impacted by stress, anxiety, and depression.  Also discusses healthy ways to reduce stress and to treat/manage anxiety and depression.  Written material given at graduation.   Education: Sleep Hygiene -Provides group verbal and written instruction about how sleep can affect your health.  Define sleep hygiene, discuss sleep cycles and impact of sleep habits. Review good sleep hygiene tips.    Initial Review & Psychosocial Screening:  Initial Psych Review & Screening - 06/26/21 0913       Initial Review   Current issues with None Identified      Family Dynamics   Good Support System? Yes   wife, curch     Barriers   Psychosocial barriers to participate in program There are no identifiable barriers or psychosocial needs.      Screening Interventions   Interventions Encouraged to exercise;To provide support and resources with identified psychosocial needs;Provide feedback about the scores to participant    Expected Outcomes Short Term goal: Utilizing psychosocial counselor, staff and physician to assist with identification of specific Stressors or current issues interfering with healing process. Setting desired goal for each stressor or current issue identified.;Long Term Goal: Stressors or current issues are controlled or eliminated.;Short  Term goal: Identification and review with participant of any Quality of Life or Depression concerns found by scoring the questionnaire.;Long Term goal: The participant improves quality of Life and PHQ9 Scores as seen by post scores and/or verbalization of changes             Quality of Life Scores:   Quality of Life - 07/07/21 1141       Quality of Life   Select Quality of Life      Quality of Life Scores   Health/Function Pre 21.6 %  Socioeconomic Pre 30 %    Psych/Spiritual Pre 29.14 %    Family Pre 30 %    GLOBAL Pre 26.23 %            Scores of 19 and below usually indicate a poorer quality of life in these areas.  A difference of  2-3 points is a clinically meaningful difference.  A difference of 2-3 points in the total score of the Quality of Life Index has been associated with significant improvement in overall quality of life, self-image, physical symptoms, and general health in studies assessing change in quality of life.  PHQ-9: Review Flowsheet  More data may exist      07/07/2021 02/06/2020 10/28/2018 09/29/2017 03/29/2017  Depression screen PHQ 2/9  Decreased Interest 0 0 0 0 0  Down, Depressed, Hopeless 0 0 0 0 0  PHQ - 2 Score 0 0 0 0 0  Altered sleeping 1 2 - - -  Tired, decreased energy 1 1 - - -  Change in appetite 0 0 - - -  Feeling bad or failure about yourself  0 0 - - -  Trouble concentrating 0 0 - - -  Moving slowly or fidgety/restless 0 0 - - -  Suicidal thoughts 0 0 - - -  PHQ-9 Score 2 3 - - -  Difficult doing work/chores Not difficult at all - - - -   Interpretation of Total Score  Total Score Depression Severity:  1-4 = Minimal depression, 5-9 = Mild depression, 10-14 = Moderate depression, 15-19 = Moderately severe depression, 20-27 = Severe depression   Psychosocial Evaluation and Intervention:  Psychosocial Evaluation - 06/26/21 0923       Psychosocial Evaluation & Interventions   Interventions Encouraged to exercise with the program  and follow exercise prescription    Comments Amaziah has no barriers to attending the program. He is ready to learn about amaginf his life to decrease symptoms. He lives with his wife and their dog. He has 7 children combined with his second wife. He has 3 out of the home and 4 children living with his ex-wife in [place].  He is ready to get started.    Expected Outcomes STG Ellison will attend all scheduled sessions and the education sessions. He will learn more about caring for himself and living with heart disease. LTG hartford maulden the tools and resources form the program after discharge to continue to care for himself in a heart healthy manner.    Continue Psychosocial Services  Follow up required by staff             Psychosocial Re-Evaluation:   Psychosocial Discharge (Final Psychosocial Re-Evaluation):   Vocational Rehabilitation: Provide vocational rehab assistance to qualifying candidates.   Vocational Rehab Evaluation & Intervention:   Education: Education Goals: Education classes will be provided on a variety of topics geared toward better understanding of heart health and risk factor modification. Participant will state understanding/return demonstration of topics presented as noted by education test scores.  Learning Barriers/Preferences:   General Cardiac Education Topics:  AED/CPR: - Group verbal and written instruction with the use of models to demonstrate the basic use of the AED with the basic ABC's of resuscitation.   Anatomy and Cardiac Procedures: - Group verbal and visual presentation and models provide information about basic cardiac anatomy and function. Reviews the testing methods done to diagnose heart disease and the outcomes of the test results. Describes the treatment choices: Medical Management, Angioplasty, or Coronary  Bypass Surgery for treating various heart conditions including Myocardial Infarction, Angina, Valve Disease, and Cardiac Arrhythmias.   Written material given at graduation. Flowsheet Row Cardiac Rehab from 07/07/2021 in [facility] Cardiac and Pulmonary Rehab  Education need identified 07/07/21       Medication Safety: - Group verbal and visual instruction to review commonly prescribed medications for heart and lung disease. Reviews the medication, class of the drug, and side effects. Includes the steps to properly store meds and maintain the prescription regimen.  Written material given at graduation.   Intimacy: - Group verbal instruction through game format to discuss how heart and lung disease can affect sexual intimacy. Written material given at graduation..   Know Your Numbers and Heart Failure: - Group verbal and visual instruction to discuss disease risk factors for cardiac and pulmonary disease and treatment options.  Reviews associated critical values for Overweight/Obesity, Hypertension, Cholesterol, and Diabetes.  Discusses basics of heart failure: signs/symptoms and treatments.  Introduces Heart Failure Zone chart for action plan for heart failure.  Written material given at graduation. Flowsheet Row Cardiac Rehab from 07/07/2021 in [facility] Cardiac and Pulmonary Rehab  Education need identified 07/07/21       Infection Prevention: - Provides verbal and written material to individual with discussion of infection control including proper hand washing and proper equipment cleaning during exercise session. Flowsheet Row Cardiac Rehab from 07/07/2021 in [facility] Cardiac and Pulmonary Rehab  Date 07/07/21  Educator [facility]  Instruction Review Code 1- Verbalizes Understanding       Falls Prevention: - Provides verbal and written material to individual with discussion of falls prevention and safety. Flowsheet Row Cardiac Rehab from 07/07/2021 in [facility] Cardiac and Pulmonary Rehab  Date 06/26/21  Educator SB  Instruction Review Code 1- Verbalizes Understanding       Other: -Provides group and verbal instruction on various topics  (see comments)   Knowledge Questionnaire Score:  Knowledge Questionnaire Score - 07/07/21 1143       Knowledge Questionnaire Score   Pre Score 23/26             Core Components/Risk Factors/Patient Goals at Admission:  Personal Goals and Risk Factors at Admission - 07/07/21 1143       Core Components/Risk Factors/Patient Goals on Admission    Weight Management Yes;Weight Loss    Intervention Weight Management: Develop a combined nutrition and exercise program designed to reach desired caloric intake, while maintaining appropriate intake of nutrient and fiber, sodium and fats, and appropriate energy expenditure required for the weight goal.;Weight Management/Obesity: Establish reasonable short term and long term weight goals.;Weight Management: Provide education and appropriate resources to help participant work on and attain dietary goals.    Admit Weight 172 lb 11.2 oz (78.3 kg)    Goal Weight: Short Term 167 lb (75.8 kg)    Goal Weight: Long Term 160 lb (72.6 kg)    Expected Outcomes Short Term: Continue to assess and modify interventions until short term weight is achieved;Long Term: Adherence to nutrition and physical activity/exercise program aimed toward attainment of established weight goal;Weight Loss: Understanding of general recommendations for a balanced deficit meal plan, which promotes 1-2 lb weight loss per week and includes a negative energy balance of [phone] kcal/d;Understanding recommendations for meals to include 15-35% energy as protein, 25-35% energy from fat, 35-60% energy from carbohydrates, less than 200mg  of dietary cholesterol, 20-35 gm of total fiber daily;Understanding of distribution of calorie intake throughout the day with the consumption of 4-5 meals/snacks  Lipids Yes    Intervention Provide education and support for participant on nutrition & aerobic/resistive exercise along with prescribed medications to achieve LDL 70mg , HDL >40mg .    Expected  Outcomes Short Term: Participant states understanding of desired cholesterol values and is compliant with medications prescribed. Participant is following exercise prescription and nutrition guidelines.;Long Term: Cholesterol controlled with medications as prescribed, with individualized exercise RX and with personalized nutrition plan. Value goals: LDL < 70mg , HDL > 40 mg.             Education:Diabetes - Individual verbal and written instruction to review signs/symptoms of diabetes, desired ranges of glucose level fasting, after meals and with exercise. Acknowledge that pre and post exercise glucose checks will be done for 3 sessions at entry of program.   Core Components/Risk Factors/Patient Goals Review:    Core Components/Risk Factors/Patient Goals at Discharge (Final Review):    ITP Comments:  ITP Comments     Row Name 06/26/21 0931 07/07/21 1137         ITP Comments irtual orientation call completed today. he has an appointment on Date: 07/07/2021  for EP eval and gym Orientation.  Documentation of diagnosis can be found in [facility]  Date: 06/18/2021 . Completed 6MWT and gym orientation. Initial ITP created and sent for review to Dr. Zetta Bills, Medical Director.               Comments: Initial ITP

## 2021-07-07 NOTE — Patient Instructions (Signed)
Patient Instructions  Patient Details  Name: Gregory Obrien MRN: [id number] Date of Birth: [birth year] Referring Provider:  Minna Merritts, MD  Below are your personal goals for exercise, nutrition, and risk factors. Our goal is to help you stay on track towards obtaining and maintaining these goals. We will be discussing your progress on these goals with you throughout the program.  Initial Exercise Prescription:  Initial Exercise Prescription - 07/07/21 1100       Date of Initial Exercise RX and Referring Provider   Date 07/07/21    Referring Provider [name] MD      Oxygen   Maintain Oxygen Saturation 88% or higher      Treadmill   MPH 2.3    Grade 0.5    Minutes 15    METs 2.92      Elliptical   Level 1    Speed 2.5    Minutes 15    METs 2      REL-XR   Level 1    Speed 50    Minutes 15    METs 2.5      Prescription Details   Frequency (times per week) 2    Duration Progress to 30 minutes of continuous aerobic without signs/symptoms of physical distress      Intensity   THRR 40-80% of Max Heartrate 96-133    Ratings of Perceived Exertion 11-13    Perceived Dyspnea 0-4      Progression   Progression Continue to progress workloads to maintain intensity without signs/symptoms of physical distress.      Resistance Training   Training Prescription Yes    Weight 4 lb    Reps 10-15             Exercise Goals: Frequency: Be able to perform aerobic exercise two to three times per week in program working toward 2-5 days per week of home exercise.  Intensity: Work with a perceived exertion of 11 (fairly light) - 15 (hard) while following your exercise prescription.  We will make changes to your prescription with you as you progress through the program.   Duration: Be able to do 30 to 45 minutes of continuous aerobic exercise in addition to a 5 minute warm-up and a 5 minute cool-down routine.   Nutrition Goals: Your personal nutrition goals will  be established when you do your nutrition analysis with the dietician.  The following are general nutrition guidelines to follow: Cholesterol < 200mg /day Sodium < 1500mg /day Fiber: Men over 50 yrs - 30 grams per day  Personal Goals:  Personal Goals and Risk Factors at Admission - 07/07/21 1143       Core Components/Risk Factors/Patient Goals on Admission    Weight Management Yes;Weight Loss    Intervention Weight Management: Develop a combined nutrition and exercise program designed to reach desired caloric intake, while maintaining appropriate intake of nutrient and fiber, sodium and fats, and appropriate energy expenditure required for the weight goal.;Weight Management/Obesity: Establish reasonable short term and long term weight goals.;Weight Management: Provide education and appropriate resources to help participant work on and attain dietary goals.    Admit Weight 172 lb 11.2 oz (78.3 kg)    Goal Weight: Short Term 167 lb (75.8 kg)    Goal Weight: Long Term 160 lb (72.6 kg)    Expected Outcomes Short Term: Continue to assess and modify interventions until short term weight is achieved;Long Term: Adherence to nutrition and physical activity/exercise program aimed toward attainment of established  weight goal;Weight Loss: Understanding of general recommendations for a balanced deficit meal plan, which promotes 1-2 lb weight loss per week and includes a negative energy balance of [phone] kcal/d;Understanding recommendations for meals to include 15-35% energy as protein, 25-35% energy from fat, 35-60% energy from carbohydrates, less than 200mg  of dietary cholesterol, 20-35 gm of total fiber daily;Understanding of distribution of calorie intake throughout the day with the consumption of 4-5 meals/snacks    Lipids Yes    Intervention Provide education and support for participant on nutrition & aerobic/resistive exercise along with prescribed medications to achieve LDL 70mg , HDL >40mg .     Expected Outcomes Short Term: Participant states understanding of desired cholesterol values and is compliant with medications prescribed. Participant is following exercise prescription and nutrition guidelines.;Long Term: Cholesterol controlled with medications as prescribed, with individualized exercise RX and with personalized nutrition plan. Value goals: LDL < 70mg , HDL > 40 mg.             Tobacco Use Initial Evaluation: Social History   Tobacco Use  Smoking Status Former   Packs/day: 1.00   Years: 10.00   Total pack years: 10.00   Types: Cigarettes   Quit date: 12/07/1990   Years since quitting: 30.6  Smokeless Tobacco Never    Exercise Goals and Review:  Exercise Goals     Row Name 07/07/21 1140             Exercise Goals   Increase Physical Activity Yes       Intervention Provide advice, education, support and counseling about physical activity/exercise needs.;Develop an individualized exercise prescription for aerobic and resistive training based on initial evaluation findings, risk stratification, comorbidities and participant's personal goals.       Expected Outcomes Short Term: Attend rehab on a regular basis to increase amount of physical activity.;Long Term: Exercising regularly at least 3-5 days a week.;Long Term: Add in home exercise to make exercise part of routine and to increase amount of physical activity.       Increase Strength and Stamina Yes       Intervention Provide advice, education, support and counseling about physical activity/exercise needs.;Develop an individualized exercise prescription for aerobic and resistive training based on initial evaluation findings, risk stratification, comorbidities and participant's personal goals.       Expected Outcomes Short Term: Perform resistance training exercises routinely during rehab and add in resistance training at home;Short Term: Increase workloads from initial exercise prescription for resistance, speed,  and METs.;Long Term: Improve cardiorespiratory fitness, muscular endurance and strength as measured by increased METs and functional capacity (14/02/1990)       Able to understand and use rate of perceived exertion (RPE) scale Yes       Intervention Provide education and explanation on how to use RPE scale       Expected Outcomes Short Term: Able to use RPE daily in rehab to express subjective intensity level;Long Term:  Able to use RPE to guide intensity level when exercising independently       Able to understand and use Dyspnea scale Yes       Intervention Provide education and explanation on how to use Dyspnea scale       Expected Outcomes Short Term: Able to use Dyspnea scale daily in rehab to express subjective sense of shortness of breath during exertion;Long Term: Able to use Dyspnea scale to guide intensity level when exercising independently       Knowledge and understanding of Target Heart Rate Range (  THRR) Yes       Intervention Provide education and explanation of THRR including how the numbers were predicted and where they are located for reference       Expected Outcomes Short Term: Able to use daily as guideline for intensity in rehab;Short Term: Able to state/look up THRR;Long Term: Able to use THRR to govern intensity when exercising independently       Able to check pulse independently Yes       Intervention Provide education and demonstration on how to check pulse in carotid and radial arteries.;Review the importance of being able to check your own pulse for safety during independent exercise       Expected Outcomes Short Term: Able to explain why pulse checking is important during independent exercise;Long Term: Able to check pulse independently and accurately       Understanding of Exercise Prescription Yes       Intervention Provide education, explanation, and written materials on patient's individual exercise prescription       Expected Outcomes Short Term: Able to explain program  exercise prescription;Long Term: Able to explain home exercise prescription to exercise independently                Copy of goals given to participant.

## 2021-07-10 DIAGNOSIS — I208 Other forms of angina pectoris: Secondary | ICD-10-CM

## 2021-07-10 NOTE — Progress Notes (Signed)
Daily Session Note  Patient Details  Name: Gregory Obrien MRN: [id number] Date of Birth: 1950/08/26 Referring Provider:   Flowsheet Row Cardiac Rehab from 07/07/2021 in [facility] Cardiac and Pulmonary Rehab  Referring Provider [name] MD       Encounter Date: 07/10/2021  Check In:  Session Check In - 07/10/21 0933       Check-In   Supervising physician immediately available to respond to emergencies See telemetry face sheet for immediately available ER MD    Location [facility]-Cardiac & Pulmonary Rehab    Staff Present Antionette Fairy, BS, Exercise Physiologist;Joseph Woodlake, RCP,RRT,BSRT;Melissa Waldron, RDN, Luther Redo, MPA, RN    Virtual Visit No    Medication changes reported     No    Fall or balance concerns reported    No    Tobacco Cessation No Change    Warm-up and Cool-down Performed on first and last piece of equipment    Resistance Training Performed Yes    VAD Patient? No    PAD/SET Patient? No      Pain Assessment   Currently in Pain? No/denies    Multiple Pain Sites No                Social History   Tobacco Use  Smoking Status Former   Packs/day: 1.00   Years: 10.00   Total pack years: 10.00   Types: Cigarettes   Quit date: 12/07/1990   Years since quitting: 30.6  Smokeless Tobacco Never    Goals Met:  Independence with exercise equipment Exercise tolerated well No report of concerns or symptoms today Strength training completed today  Goals Unmet:  Not Applicable  Comments: First full day of exercise!  Patient was oriented to gym and equipment including functions, settings, policies, and procedures.  Patient's individual exercise prescription and treatment plan were reviewed.  All starting workloads were established based on the results of the 6 minute walk test done at initial orientation visit.  The plan for exercise progression was also introduced and progression will be customized based on patient's performance and goals.    Dr.  Emily Filbert is Medical Director for [place].  Dr. Ottie Glazier is Medical Director for [facility] Pulmonary Rehabilitation.

## 2021-07-15 DIAGNOSIS — I208 Other forms of angina pectoris: Secondary | ICD-10-CM

## 2021-07-15 NOTE — Progress Notes (Signed)
Daily Session Note  Patient Details  Name: Gregory Obrien MRN: [id number] Date of Birth: [birth year] Referring Provider:   Flowsheet Row Cardiac Rehab from 07/07/2021 in [facility] Cardiac and Pulmonary Rehab  Referring Provider [name] MD       Encounter Date: 07/15/2021  Check In:  Session Check In - 07/15/21 0924       Check-In   Supervising physician immediately available to respond to emergencies See telemetry face sheet for immediately available ER MD    Location [facility]-Cardiac & Pulmonary Rehab    Staff Present Earlean Shawl, BS, ACSM CEP, Exercise Physiologist;Margel Joens Rosalia Hammers, MPA, RN;Jessica Luan Pulling, MA, RCEP, CCRP, CCET    Virtual Visit No    Medication changes reported     No    Fall or balance concerns reported    No    Tobacco Cessation No Change    Warm-up and Cool-down Performed on first and last piece of equipment    Resistance Training Performed Yes    VAD Patient? No    PAD/SET Patient? No      Pain Assessment   Currently in Pain? No/denies                Social History   Tobacco Use  Smoking Status Former   Packs/day: 1.00   Years: 10.00   Total pack years: 10.00   Types: Cigarettes   Quit date: 12/07/1990   Years since quitting: 30.6  Smokeless Tobacco Never    Goals Met:  Independence with exercise equipment Exercise tolerated well No report of concerns or symptoms today Strength training completed today  Goals Unmet:  Not Applicable  Comments: Pt able to follow exercise prescription today without complaint.  Will continue to monitor for progression.    Dr. Emily Filbert is Medical Director for [place].  Dr. Ottie Glazier is Medical Director for [facility] Pulmonary Rehabilitation.

## 2021-07-16 ENCOUNTER — Encounter: Payer: Self-pay | Admitting: *Deleted

## 2021-07-16 DIAGNOSIS — I208 Other forms of angina pectoris: Secondary | ICD-10-CM

## 2021-07-16 NOTE — Progress Notes (Signed)
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 Swallow whole., Disp: 360 tablet, Rfl: 0   cetirizine (ZYRTEC) 10 MG tablet, Take 5 mg by mouth every other day., Disp: , Rfl:    ezetimibe (ZETIA) 10 MG tablet, Take 1 tablet (10 mg total) by mouth daily., Disp: 90 tablet, Rfl: 3   famotidine (PEPCID) 20 MG tablet, Take 1 tablet (20 mg total) by mouth 2 (two) times daily., Disp: 60 tablet, Rfl: 5   isosorbide mononitrate (IMDUR) 30 MG 24 hr tablet, Take 1 tablet (30 mg total) by mouth daily., Disp: 30 tablet, Rfl: 3   metoprolol succinate (TOPROL XL) 25 MG 24 hr tablet, Take 0.5 tablets (12.5 mg total) by mouth daily., Disp: 15 tablet, Rfl: 11   rosuvastatin (CRESTOR) 40 MG tablet, Take 1 tablet (40 mg total) by mouth daily., Disp: 90 tablet, Rfl: 3   sildenafil (REVATIO) 20 MG tablet, 2-5 tabs 1 hour prior to intercourse, Disp: 50 tablet, Rfl: 11  Past Medical History: Past Medical History:  Diagnosis Date   Blood in stool    H/O arthroscopy of knee    History of colon polyps    UC (ulcerative colitis) ([place])    Ulcerative colitis ([place])    remission since 2017, see Dr. Willow Ora    Tobacco Use: Social History   Tobacco Use  Smoking Status Former   Packs/day: 1.00   Years: 10.00   Total pack years: 10.00   Types: Cigarettes   Quit date: 12/07/1990   Years since quitting: 30.6  Smokeless Tobacco Never    Labs: Review Flowsheet  More data exists       Latest Ref Rng & Units 09/24/2017 10/28/2018 02/06/2020 05/08/2021 06/24/2021  Labs for ITP Cardiac and Pulmonary Rehab  Cholestrol 0 - 200 mg/dL 217  286  201  237  192   LDL (calc) 0 - 99 mg/dL 144  204  125  - -  Direct LDL mg/dL - - - 169.0  114.0   HDL-C >39.00 mg/dL 36.70  48.20  42.30  42.50  37.90   Trlycerides 0.0 - 149.0 mg/dL 183.0  171.0  168.0  391.0  271.0   Hemoglobin A1c 4.6 - 6.5 % 6.1  6.1  6.2  - -     Exercise Target Goals: Exercise Program Goal: Individual exercise prescription set using results from initial 6 min walk test and THRR while considering  patient's activity barriers and safety.   Exercise Prescription Goal: Initial exercise prescription builds to 30-45 minutes a day of aerobic activity, 2-3 days per week.  Home exercise guidelines will be given to patient during program as part of exercise prescription that the participant will acknowledge.   Education: Aerobic Exercise: - Group verbal and visual presentation on the components of exercise prescription. Introduces F.I.T.T principle from ACSM for exercise prescriptions.  Reviews F.I.T.T. principles of aerobic exercise including progression. Written material given at graduation. [facility]  from 07/10/2021 in [facility] Cardiac and Pulmonary Rehab  Date 07/10/21  Educator [facility]  Instruction Review Code 1- Verbalizes Understanding       Education: Resistance Exercise: - Group verbal and visual presentation on the components of exercise prescription. Introduces F.I.T.T principle from ACSM for exercise prescriptions  Reviews F.I.T.T. principles of resistance exercise including progression. Written material given at graduation.    Education: Exercise & Equipment Safety: - Individual verbal instruction and demonstration of equipment use and safety with use of the equipment. Flowsheet Row Cardiac Rehab from 07/10/2021 in [facility] Cardiac and Pulmonary Rehab  Date 07/07/21  Educator [facility]  Instruction  Review Code 1- Verbalizes Understanding       Education: Exercise Physiology & General Exercise Guidelines: - Group verbal and written instruction with models to review the exercise physiology of the cardiovascular system and associated critical values. Provides general exercise guidelines with specific guidelines to those with heart or lung disease.    Education: Flexibility, Balance, Mind/Body Relaxation: - Group verbal and visual presentation with interactive activity on the components of exercise prescription. Introduces F.I.T.T principle from ACSM for exercise prescriptions. Reviews F.I.T.T. principles of flexibility and balance exercise training including progression. Also discusses the mind body connection.  Reviews various relaxation techniques to help reduce and manage stress (i.e. Deep breathing, progressive muscle relaxation, and visualization). Balance handout provided to take home. Written material given at graduation.   Activity Barriers & Risk Stratification:  Activity Barriers & Cardiac Risk Stratification - 07/07/21 1138       Activity Barriers & Cardiac Risk Stratification   Activity Barriers Back Problems;Deconditioning;Muscular Weakness;Shortness of Breath;Joint Problems;Chest Pain/Angina   back surgery, bilateral knee arthoscopy   Cardiac Risk Stratification Moderate             6 Minute Walk:  6 Minute Walk     Row Name 07/07/21 1137         6 Minute Walk   Phase Initial     Distance 1270 feet     Walk Time 6 minutes     # of Rest Breaks 0     MPH 2.41     METS 2.74     RPE 7     Perceived Dyspnea  1     VO2 Peak 9.57     Symptoms Yes (comment)     Comments SOB, hips sore wiht walking 1/10, no chest pain     Resting HR 60 bpm     Resting BP 116/52     Resting Oxygen Saturation  94 %     Exercise Oxygen Saturation  during 6 min walk 94 %     Max Ex. HR 83 bpm     Max Ex. BP 126/70     2 Minute Post BP 116/62              Oxygen Initial  Assessment:   Oxygen Re-Evaluation:   Oxygen Discharge (Final Oxygen Re-Evaluation):   Initial Exercise Prescription:  Initial Exercise Prescription - 07/07/21 1100       Date of Initial Exercise RX and Referring Provider   Date 07/07/21    Referring Provider Julien Nordmann MD      Oxygen   Maintain Oxygen Saturation 88% or higher      Treadmill   MPH 2.3    Grade 0.5    Minutes 15    METs 2.92      Elliptical   Level 1    Speed 2.5  Minutes 15    METs 2      REL-XR   Level 1    Speed 50    Minutes 15    METs 2.5      Prescription Details   Frequency (times per week) 2    Duration Progress to 30 minutes of continuous aerobic without signs/symptoms of physical distress      Intensity   THRR 40-80% of Max Heartrate 96-133    Ratings of Perceived Exertion 11-13    Perceived Dyspnea 0-4      Progression   Progression Continue to progress workloads to maintain intensity without signs/symptoms of physical distress.      Resistance Training   Training Prescription Yes    Weight 4 lb    Reps 10-15             Perform Capillary Blood Glucose checks as needed.  Exercise Prescription Changes:   Exercise Prescription Changes     Row Name 07/07/21 1100             Response to Exercise   Blood Pressure (Admit) 116/52       Blood Pressure (Exercise) 126/70       Blood Pressure (Exit) 116/62       Heart Rate (Admit) 60 bpm       Heart Rate (Exercise) 83 bpm       Heart Rate (Exit) 58 bpm       Oxygen Saturation (Admit) 94 %       Oxygen Saturation (Exercise) 94 %       Rating of Perceived Exertion (Exercise) 7       Perceived Dyspnea (Exercise) 1       Symptoms SOB, hips sore 1/10       Comments walk test results                Exercise Comments:   Exercise Comments     Row Name 07/10/21 0934           Exercise Comments First full day of exercise!  Patient was oriented to gym and equipment including functions, settings,  policies, and procedures.  Patient's individual exercise prescription and treatment plan were reviewed.  All starting workloads were established based on the results of the 6 minute walk test done at initial orientation visit.  The plan for exercise progression was also introduced and progression will be customized based on patient's performance and goals.                Exercise Goals and Review:   Exercise Goals     Row Name 07/07/21 1140             Exercise Goals   Increase Physical Activity Yes       Intervention Provide advice, education, support and counseling about physical activity/exercise needs.;Develop an individualized exercise prescription for aerobic and resistive training based on initial evaluation findings, risk stratification, comorbidities and participant's personal goals.       Expected Outcomes Short Term: Attend rehab on a regular basis to increase amount of physical activity.;Long Term: Exercising regularly at least 3-5 days a week.;Long Term: Add in home exercise to make exercise part of routine and to increase amount of physical activity.       Increase Strength and Stamina Yes       Intervention Provide advice, education, support and counseling about physical activity/exercise needs.;Develop an individualized exercise prescription for aerobic and resistive training based on initial evaluation  findings, risk stratification, comorbidities and participant's personal goals.       Expected Outcomes Short Term: Perform resistance training exercises routinely during rehab and add in resistance training at home;Short Term: Increase workloads from initial exercise prescription for resistance, speed, and METs.;Long Term: Improve cardiorespiratory fitness, muscular endurance and strength as measured by increased METs and functional capacity (6MWT)       Able to understand and use rate of perceived exertion (RPE) scale Yes       Intervention Provide education and explanation  on how to use RPE scale       Expected Outcomes Short Term: Able to use RPE daily in rehab to express subjective intensity level;Long Term:  Able to use RPE to guide intensity level when exercising independently       Able to understand and use Dyspnea scale Yes       Intervention Provide education and explanation on how to use Dyspnea scale       Expected Outcomes Short Term: Able to use Dyspnea scale daily in rehab to express subjective sense of shortness of breath during exertion;Long Term: Able to use Dyspnea scale to guide intensity level when exercising independently       Knowledge and understanding of Target Heart Rate Range (THRR) Yes       Intervention Provide education and explanation of THRR including how the numbers were predicted and where they are located for reference       Expected Outcomes Short Term: Able to use daily as guideline for intensity in rehab;Short Term: Able to state/look up THRR;Long Term: Able to use THRR to govern intensity when exercising independently       Able to check pulse independently Yes       Intervention Provide education and demonstration on how to check pulse in carotid and radial arteries.;Review the importance of being able to check your own pulse for safety during independent exercise       Expected Outcomes Short Term: Able to explain why pulse checking is important during independent exercise;Long Term: Able to check pulse independently and accurately       Understanding of Exercise Prescription Yes       Intervention Provide education, explanation, and written materials on patient's individual exercise prescription       Expected Outcomes Short Term: Able to explain program exercise prescription;Long Term: Able to explain home exercise prescription to exercise independently                Exercise Goals Re-Evaluation :  Exercise Goals Re-Evaluation     Row Name 07/10/21 0934             Exercise Goal Re-Evaluation   Exercise Goals  Review Increase Physical Activity;Able to understand and use rate of perceived exertion (RPE) scale;Knowledge and understanding of Target Heart Rate Range (THRR);Understanding of Exercise Prescription;Able to check pulse independently;Able to understand and use Dyspnea scale;Increase Strength and Stamina       Comments Reviewed RPE and dyspnea scales, THR and program prescription with pt today.  Pt voiced understanding and was given a copy of goals to take home.       Expected Outcomes Short: Use RPE daily to regulate intensity. Long: Follow program prescription in THR.                Discharge Exercise Prescription (Final Exercise Prescription Changes):  Exercise Prescription Changes - 07/07/21 1100       Response to Exercise   Blood Pressure (  Admit) 116/52    Blood Pressure (Exercise) 126/70    Blood Pressure (Exit) 116/62    Heart Rate (Admit) 60 bpm    Heart Rate (Exercise) 83 bpm    Heart Rate (Exit) 58 bpm    Oxygen Saturation (Admit) 94 %    Oxygen Saturation (Exercise) 94 %    Rating of Perceived Exertion (Exercise) 7    Perceived Dyspnea (Exercise) 1    Symptoms SOB, hips sore 1/10    Comments walk test results             Nutrition:  Target Goals: Understanding of nutrition guidelines, daily intake of sodium 1500mg , cholesterol 200mg , calories 30% from fat and 7% or less from saturated fats, daily to have 5 or more servings of fruits and vegetables.  Education: All About Nutrition: -Group instruction provided by verbal, written material, interactive activities, discussions, models, and posters to present general guidelines for heart healthy nutrition including fat, fiber, MyPlate, the role of sodium in heart healthy nutrition, utilization of the nutrition label, and utilization of this knowledge for meal planning. Follow up email sent as well. Written material given at graduation. Flowsheet Row Cardiac Rehab from 07/10/2021 in [facility] Cardiac and Pulmonary Rehab   Education need identified 07/07/21       Biometrics:  Pre Biometrics - 07/07/21 1140       Pre Biometrics   Height 5' 7.1" (1.704 m)    Weight 172 lb 11.2 oz (78.3 kg)    BMI (Calculated) 26.98    Single Leg Stand 23.5 seconds              Nutrition Therapy Plan and Nutrition Goals:  Nutrition Therapy & Goals - 07/07/21 1143       Intervention Plan   Intervention Prescribe, educate and counsel regarding individualized specific dietary modifications aiming towards targeted core components such as weight, hypertension, lipid management, diabetes, heart failure and other comorbidities.    Expected Outcomes Short Term Goal: Understand basic principles of dietary content, such as calories, fat, sodium, cholesterol and nutrients.;Short Term Goal: A plan has been developed with personal nutrition goals set during dietitian appointment.;Long Term Goal: Adherence to prescribed nutrition plan.             Nutrition Assessments:  MEDIFICTS Score Key: ?70 Need to make dietary changes  40-70 Heart Healthy Diet ? 40 Therapeutic Level Cholesterol Diet   Picture Your Plate Scores: D34-534 Unhealthy dietary pattern with much room for improvement. 41-50 Dietary pattern unlikely to meet recommendations for good health and room for improvement. 51-60 More healthful dietary pattern, with some room for improvement.  >60 Healthy dietary pattern, although there may be some specific behaviors that could be improved.    Nutrition Goals Re-Evaluation:   Nutrition Goals Discharge (Final Nutrition Goals Re-Evaluation):   Psychosocial: Target Goals: Acknowledge presence or absence of significant depression and/or stress, maximize coping skills, provide positive support system. Participant is able to verbalize types and ability to use techniques and skills needed for reducing stress and depression.   Education: Stress, Anxiety, and Depression - Group verbal and visual presentation to  define topics covered.  Reviews how body is impacted by stress, anxiety, and depression.  Also discusses healthy ways to reduce stress and to treat/manage anxiety and depression.  Written material given at graduation.   Education: Sleep Hygiene -Provides group verbal and written instruction about how sleep can affect your health.  Define sleep hygiene, discuss sleep cycles and impact of sleep habits.  Review good sleep hygiene tips.    Initial Review & Psychosocial Screening:  Initial Psych Review & Screening - 06/26/21 0913       Initial Review   Current issues with None Identified      Family Dynamics   Good Support System? Yes   wife, curch     Barriers   Psychosocial barriers to participate in program There are no identifiable barriers or psychosocial needs.      Screening Interventions   Interventions Encouraged to exercise;To provide support and resources with identified psychosocial needs;Provide feedback about the scores to participant    Expected Outcomes Short Term goal: Utilizing psychosocial counselor, staff and physician to assist with identification of specific Stressors or current issues interfering with healing process. Setting desired goal for each stressor or current issue identified.;Long Term Goal: Stressors or current issues are controlled or eliminated.;Short Term goal: Identification and review with participant of any Quality of Life or Depression concerns found by scoring the questionnaire.;Long Term goal: The participant improves quality of Life and PHQ9 Scores as seen by post scores and/or verbalization of changes             Quality of Life Scores:   Quality of Life - 07/07/21 1141       Quality of Life   Select Quality of Life      Quality of Life Scores   Health/Function Pre 21.6 %    Socioeconomic Pre 30 %    Psych/Spiritual Pre 29.14 %    Family Pre 30 %    GLOBAL Pre 26.23 %            Scores of 19 and below usually indicate a poorer  quality of life in these areas.  A difference of  2-3 points is a clinically meaningful difference.  A difference of 2-3 points in the total score of the Quality of Life Index has been associated with significant improvement in overall quality of life, self-image, physical symptoms, and general health in studies assessing change in quality of life.  PHQ-9: Review Flowsheet  More data may exist      07/07/2021 02/06/2020 10/28/2018 09/29/2017 03/29/2017  Depression screen PHQ 2/9  Decreased Interest 0 0 0 0 0  Down, Depressed, Hopeless 0 0 0 0 0  PHQ - 2 Score 0 0 0 0 0  Altered sleeping 1 2 - - -  Tired, decreased energy 1 1 - - -  Change in appetite 0 0 - - -  Feeling bad or failure about yourself  0 0 - - -  Trouble concentrating 0 0 - - -  Moving slowly or fidgety/restless 0 0 - - -  Suicidal thoughts 0 0 - - -  PHQ-9 Score 2 3 - - -  Difficult doing work/chores Not difficult at all - - - -   Interpretation of Total Score  Total Score Depression Severity:  1-4 = Minimal depression, 5-9 = Mild depression, 10-14 = Moderate depression, 15-19 = Moderately severe depression, 20-27 = Severe depression   Psychosocial Evaluation and Intervention:  Psychosocial Evaluation - 06/26/21 0923       Psychosocial Evaluation & Interventions   Interventions Encouraged to exercise with the program and follow exercise prescription    Comments Zymier has no barriers to attending the program. He is ready to learn about amaginf his life to decrease symptoms. He lives with his wife and their dog. He has 7 children combined with his second wife. He has 3  out of the home and 4 children living with his ex-wife in [place].  He is ready to get started.    Expected Outcomes STG Deshaun will attend all scheduled sessions and the education sessions. He will learn more about caring for himself and living with heart disease. LTG nedal sabori the tools and resources form the program after discharge to continue to care  for himself in a heart healthy manner.    Continue Psychosocial Services  Follow up required by staff             Psychosocial Re-Evaluation:   Psychosocial Discharge (Final Psychosocial Re-Evaluation):   Vocational Rehabilitation: Provide vocational rehab assistance to qualifying candidates.   Vocational Rehab Evaluation & Intervention:   Education: Education Goals: Education classes will be provided on a variety of topics geared toward better understanding of heart health and risk factor modification. Participant will state understanding/return demonstration of topics presented as noted by education test scores.  Learning Barriers/Preferences:   General Cardiac Education Topics:  AED/CPR: - Group verbal and written instruction with the use of models to demonstrate the basic use of the AED with the basic ABC's of resuscitation.   Anatomy and Cardiac Procedures: - Group verbal and visual presentation and models provide information about basic cardiac anatomy and function. Reviews the testing methods done to diagnose heart disease and the outcomes of the test results. Describes the treatment choices: Medical Management, Angioplasty, or Coronary Bypass Surgery for treating various heart conditions including Myocardial Infarction, Angina, Valve Disease, and Cardiac Arrhythmias.  Written material given at graduation. Flowsheet Row Cardiac Rehab from 07/10/2021 in [facility] Cardiac and Pulmonary Rehab  Education need identified 07/07/21       Medication Safety: - Group verbal and visual instruction to review commonly prescribed medications for heart and lung disease. Reviews the medication, class of the drug, and side effects. Includes the steps to properly store meds and maintain the prescription regimen.  Written material given at graduation.   Intimacy: - Group verbal instruction through game format to discuss how heart and lung disease can affect sexual intimacy. Written  material given at graduation.. Flowsheet Row Cardiac Rehab from 07/10/2021 in [facility] Cardiac and Pulmonary Rehab  Date 07/10/21  Educator [facility]  Instruction Review Code 1- Verbalizes Understanding       Know Your Numbers and Heart Failure: - Group verbal and visual instruction to discuss disease risk factors for cardiac and pulmonary disease and treatment options.  Reviews associated critical values for Overweight/Obesity, Hypertension, Cholesterol, and Diabetes.  Discusses basics of heart failure: signs/symptoms and treatments.  Introduces Heart Failure Zone chart for action plan for heart failure.  Written material given at graduation. Flowsheet Row Cardiac Rehab from 07/10/2021 in [facility] Cardiac and Pulmonary Rehab  Education need identified 07/07/21       Infection Prevention: - Provides verbal and written material to individual with discussion of infection control including proper hand washing and proper equipment cleaning during exercise session. Flowsheet Row Cardiac Rehab from 07/10/2021 in [facility] Cardiac and Pulmonary Rehab  Date 07/07/21  Educator [facility]  Instruction Review Code 1- Verbalizes Understanding       Falls Prevention: - Provides verbal and written material to individual with discussion of falls prevention and safety. Flowsheet Row Cardiac Rehab from 07/10/2021 in [facility] Cardiac and Pulmonary Rehab  Date 06/26/21  Educator [facility]  Instruction Review Code 1- Verbalizes Understanding       Other: -Provides group and verbal instruction on various topics (see comments)  Knowledge Questionnaire Score:  Knowledge Questionnaire Score - 07/07/21 1143       Knowledge Questionnaire Score   Pre Score 23/26             Core Components/Risk Factors/Patient Goals at Admission:  Personal Goals and Risk Factors at Admission - 07/07/21 1143       Core Components/Risk Factors/Patient Goals on Admission    Weight Management Yes;Weight Loss    Intervention Weight Management:  Develop a combined nutrition and exercise program designed to reach desired caloric intake, while maintaining appropriate intake of nutrient and fiber, sodium and fats, and appropriate energy expenditure required for the weight goal.;Weight Management/Obesity: Establish reasonable short term and long term weight goals.;Weight Management: Provide education and appropriate resources to help participant work on and attain dietary goals.    Admit Weight 172 lb 11.2 oz (78.3 kg)    Goal Weight: Short Term 167 lb (75.8 kg)    Goal Weight: Long Term 160 lb (72.6 kg)    Expected Outcomes Short Term: Continue to assess and modify interventions until short term weight is achieved;Long Term: Adherence to nutrition and physical activity/exercise program aimed toward attainment of established weight goal;Weight Loss: Understanding of general recommendations for a balanced deficit meal plan, which promotes 1-2 lb weight loss per week and includes a negative energy balance of [phone] kcal/d;Understanding recommendations for meals to include 15-35% energy as protein, 25-35% energy from fat, 35-60% energy from carbohydrates, less than 200mg  of dietary cholesterol, 20-35 gm of total fiber daily;Understanding of distribution of calorie intake throughout the day with the consumption of 4-5 meals/snacks    Lipids Yes    Intervention Provide education and support for participant on nutrition & aerobic/resistive exercise along with prescribed medications to achieve LDL 70mg , HDL >40mg .    Expected Outcomes Short Term: Participant states understanding of desired cholesterol values and is compliant with medications prescribed. Participant is following exercise prescription and nutrition guidelines.;Long Term: Cholesterol controlled with medications as prescribed, with individualized exercise RX and with personalized nutrition plan. Value goals: LDL < 70mg , HDL > 40 mg.             Education:Diabetes - Individual verbal and  written instruction to review signs/symptoms of diabetes, desired ranges of glucose level fasting, after meals and with exercise. Acknowledge that pre and post exercise glucose checks will be done for 3 sessions at entry of program.   Core Components/Risk Factors/Patient Goals Review:    Core Components/Risk Factors/Patient Goals at Discharge (Final Review):    ITP Comments:  ITP Comments     Row Name 06/26/21 0931 07/07/21 1137 07/10/21 0934 07/16/21 1025     ITP Comments irtual orientation call completed today. he has an appointment on Date: 07/07/2021  for EP eval and gym Orientation.  Documentation of diagnosis [facility] be found in [facility]  Date: 06/18/2021 . Completed [facility] and gym orientation. Initial ITP created and sent for review to Dr. 06/20/2021, Medical Director. First full day of exercise!  Patient was oriented to gym and equipment including functions, settings, policies, and procedures.  Patient's individual exercise prescription and treatment plan were reviewed.  All starting workloads were established based on the results of the 6 minute walk test done at initial orientation visit.  The plan for exercise progression was also introduced and progression will be customized based on patient's performance and goals. 30 Day review completed. Medical Director ITP review done, changes made as directed, and signed approval by Medical Director.   NEW  Comments:

## 2021-07-17 DIAGNOSIS — I208 Other forms of angina pectoris: Secondary | ICD-10-CM

## 2021-07-17 NOTE — Progress Notes (Signed)
Daily Session Note  Patient Details  Name: Gregory Obrien MRN: [id number] Date of Birth: Jun 18, 1950 Referring Provider:   Flowsheet Row Cardiac Rehab from 07/07/2021 in [facility] Cardiac and Pulmonary Rehab  Referring Provider [name] MD       Encounter Date: 07/17/2021  Check In:  Session Check In - 07/17/21 0930       Check-In   Supervising physician immediately available to respond to emergencies See telemetry face sheet for immediately available ER MD    Location [facility]-Cardiac & Pulmonary Rehab    Staff Present Birdie Sons, MPA, RN;Melissa Shiro, RDN, LDN;Joseph [place], RCP,RRT,BSRT    Virtual Visit No    Medication changes reported     No    Fall or balance concerns reported    No    Tobacco Cessation No Change    Warm-up and Cool-down Performed on first and last piece of equipment    Resistance Training Performed Yes    VAD Patient? No    PAD/SET Patient? No      Pain Assessment   Currently in Pain? No/denies                Social History   Tobacco Use  Smoking Status Former   Packs/day: 1.00   Years: 10.00   Total pack years: 10.00   Types: Cigarettes   Quit date: 12/07/1990   Years since quitting: 30.6  Smokeless Tobacco Never    Goals Met:  Independence with exercise equipment Exercise tolerated well No report of concerns or symptoms today Strength training completed today  Goals Unmet:  Not Applicable  Comments: Pt able to follow exercise prescription today without complaint.  Will continue to monitor for progression.    Dr. Emily Filbert is Medical Director for [place].  Dr. Ottie Glazier is Medical Director for [facility].

## 2021-07-22 DIAGNOSIS — I208 Other forms of angina pectoris: Secondary | ICD-10-CM

## 2021-07-22 NOTE — Progress Notes (Signed)
Daily Session Note  Patient Details  Name: Gregory Obrien MRN: [id number] Date of Birth: 1950/03/29 Referring Provider:   Flowsheet Row Cardiac Rehab from 07/07/2021 in [facility] Cardiac and Pulmonary Rehab  Referring Provider [name] MD       Encounter Date: 07/22/2021  Check In:  Session Check In - 07/22/21 0928       Check-In   Supervising physician immediately available to respond to emergencies See telemetry face sheet for immediately available ER MD    Location [facility]-Cardiac & Pulmonary Rehab    Staff Present Will Bonnet, RN,BC,MSN;Kelly Amedeo Plenty, BS, ACSM CEP, Exercise Physiologist;Jessica Luan Pulling, MA, RCEP, CCRP, CCET    Virtual Visit No    Medication changes reported     No    Fall or balance concerns reported    No    Warm-up and Cool-down Performed on first and last piece of equipment    Resistance Training Performed Yes    VAD Patient? No    PAD/SET Patient? No      Pain Assessment   Currently in Pain? No/denies    Multiple Pain Sites No                Social History   Tobacco Use  Smoking Status Former   Packs/day: 1.00   Years: 10.00   Total pack years: 10.00   Types: Cigarettes   Quit date: 12/07/1990   Years since quitting: 30.6  Smokeless Tobacco Never    Goals Met:  Independence with exercise equipment Exercise tolerated well No report of concerns or symptoms today  Goals Unmet:  Not Applicable  Comments: Pt able to follow exercise prescription today without complaint.  Will continue to monitor for progression.   Reviewed home exercise with pt today.  Pt plans to walk, swim, and use his exercise equipment for exercise.  Reviewed THR, pulse, RPE, sign and symptoms, pulse oximetery and when to call 911 or MD.  Also discussed weather considerations and indoor options.  Pt voiced understanding.    Dr. Emily Filbert is Medical Director for [place].  Dr. Ottie Glazier is Medical Director for [facility] Pulmonary  Rehabilitation.

## 2021-07-24 ENCOUNTER — Encounter: Payer: Medicare PPO | Admitting: *Deleted

## 2021-07-24 DIAGNOSIS — I208 Other forms of angina pectoris: Secondary | ICD-10-CM | POA: Diagnosis not present

## 2021-07-24 NOTE — Progress Notes (Signed)
Daily Session Note  Patient Details  Name: Gregory Obrien MRN: [id number] Date of Birth: 06-Nov-1950 Referring Provider:   Flowsheet Row Cardiac Rehab from 07/07/2021 in [facility] Cardiac and Pulmonary Rehab  Referring Provider [name] MD       Encounter Date: 07/24/2021  Check In:  Session Check In - 07/24/21 1005       Check-In   Supervising physician immediately available to respond to emergencies See telemetry face sheet for immediately available ER MD    Location [facility]-Cardiac & Pulmonary Rehab    Staff Present Heath Lark, RN, BSN, CCRP;Kristen Coble, RN,BC,MSN;Melissa [place], RDN, LDN;Jessica Fromberg, MA, RCEP, CCRP, CCET    Virtual Visit No    Medication changes reported     No    Fall or balance concerns reported    No    Warm-up and Cool-down Performed on first and last piece of equipment    Resistance Training Performed Yes    VAD Patient? No    PAD/SET Patient? No      Pain Assessment   Currently in Pain? No/denies                Social History   Tobacco Use  Smoking Status Former   Packs/day: 1.00   Years: 10.00   Total pack years: 10.00   Types: Cigarettes   Quit date: 12/07/1990   Years since quitting: 30.6  Smokeless Tobacco Never    Goals Met:  Independence with exercise equipment Exercise tolerated well No report of concerns or symptoms today  Goals Unmet:  Not Applicable  Comments: Pt able to follow exercise prescription today without complaint.  Will continue to monitor for progression.    Dr. Emily Filbert is Medical Director for [place].  Dr. Ottie Glazier is Medical Director for [facility] Pulmonary Rehabilitation.

## 2021-07-29 DIAGNOSIS — I208 Other forms of angina pectoris: Secondary | ICD-10-CM

## 2021-07-29 NOTE — Progress Notes (Signed)
Daily Session Note  Patient Details  Name: Cypress Fanfan MRN: [id number] Date of Birth: [birth year] Referring Provider:   Flowsheet Row Cardiac Rehab from 07/07/2021 in [facility] Cardiac and Pulmonary Rehab  Referring Provider [name] MD       Encounter Date: 07/29/2021  Check In:  Session Check In - 07/29/21 0930       Check-In   Supervising physician immediately available to respond to emergencies See telemetry face sheet for immediately available ER MD    Location [facility]-Cardiac & Pulmonary Rehab    Staff Present Earlean Shawl, BS, ACSM CEP, Exercise Physiologist;Merle Cirelli, RN,BC,MSN;Jessica [place], MA, RCEP, CCRP, CCET    Virtual Visit No    Medication changes reported     No    Fall or balance concerns reported    No    Tobacco Cessation No Change    Warm-up and Cool-down Performed on first and last piece of equipment    Resistance Training Performed Yes    VAD Patient? No    PAD/SET Patient? No      Pain Assessment   Currently in Pain? No/denies    Multiple Pain Sites No                Social History   Tobacco Use  Smoking Status Former   Packs/day: 1.00   Years: 10.00   Total pack years: 10.00   Types: Cigarettes   Quit date: 12/07/1990   Years since quitting: 30.6  Smokeless Tobacco Never    Goals Met:  Independence with exercise equipment Exercise tolerated well No report of concerns or symptoms today Strength training completed today  Goals Unmet:  Not Applicable  Comments: Pt able to follow exercise prescription today without complaint.  Will continue to monitor for progression.    Dr. Emily Filbert is Medical Director for [place].  Dr. Ottie Glazier is Medical Director for [facility] Pulmonary Rehabilitation.

## 2021-07-31 ENCOUNTER — Encounter: Payer: Medicare PPO | Admitting: *Deleted

## 2021-07-31 DIAGNOSIS — I208 Other forms of angina pectoris: Secondary | ICD-10-CM | POA: Diagnosis not present

## 2021-07-31 NOTE — Progress Notes (Signed)
Daily Session Note  Patient Details  Name: Gregory Obrien MRN: [id number] Date of Birth: Jan 13, 1950 Referring Provider:   Flowsheet Row Cardiac Rehab from 07/07/2021 in [facility] Cardiac and Pulmonary Rehab  Referring Provider [name] MD       Encounter Date: 07/31/2021  Check In:  Session Check In - 07/31/21 0948       Check-In   Supervising physician immediately available to respond to emergencies See telemetry face sheet for immediately available ER MD    Location [facility]-Cardiac & Pulmonary Rehab    Staff Present Heath Lark, RN, BSN, Jacklynn Bue, MS, ASCM CEP, Exercise Physiologist;Jessica Luan Pulling, MA, RCEP, CCRP, CCET    Virtual Visit No    Medication changes reported     No    Fall or balance concerns reported    No    Warm-up and Cool-down Performed on first and last piece of equipment    Resistance Training Performed Yes    VAD Patient? No    PAD/SET Patient? No      Pain Assessment   Currently in Pain? No/denies                Social History   Tobacco Use  Smoking Status Former   Packs/day: 1.00   Years: 10.00   Total pack years: 10.00   Types: Cigarettes   Quit date: 12/07/1990   Years since quitting: 30.6  Smokeless Tobacco Never    Goals Met:  Independence with exercise equipment Exercise tolerated well No report of concerns or symptoms today  Goals Unmet:  Not Applicable  Comments: Pt able to follow exercise prescription today without complaint.  Will continue to monitor for progression.    Dr. Emily Filbert is Medical Director for [place].  Dr. Ottie Glazier is Medical Director for [facility] Pulmonary Rehabilitation.

## 2021-08-04 DIAGNOSIS — I208 Other forms of angina pectoris: Secondary | ICD-10-CM

## 2021-08-04 NOTE — Progress Notes (Signed)
Completed initial RD Consultation °

## 2021-08-05 ENCOUNTER — Encounter: Payer: Medicare PPO | Attending: Cardiovascular Disease | Admitting: *Deleted

## 2021-08-05 ENCOUNTER — Encounter: Payer: Self-pay | Admitting: *Deleted

## 2021-08-05 DIAGNOSIS — I208 Other forms of angina pectoris: Secondary | ICD-10-CM | POA: Insufficient documentation

## 2021-08-05 DIAGNOSIS — Z006 Encounter for examination for normal comparison and control in clinical research program: Secondary | ICD-10-CM

## 2021-08-05 NOTE — Research (Signed)
Spoke with Gregory Obrien about [employer] study. He requested more information. Emailed him a copy of the consent to read and encouraged him to call with any questions.

## 2021-08-05 NOTE — Progress Notes (Signed)
Daily Session Note  Patient Details  Name: Gregory Obrien MRN: [id number] Date of Birth: 11/22/1950 Referring Provider:   Flowsheet Row Cardiac Rehab from 07/07/2021 in [facility] Cardiac and Pulmonary Rehab  Referring Provider Gollan, Timothy MD       Encounter Date: 08/05/2021  Check In:  Session Check In - 08/05/21 1042       Check-In   Supervising physician immediately available to respond to emergencies See telemetry face sheet for immediately available ER MD    Location [facility]-Cardiac & Pulmonary Rehab    Staff Present  , RN, BSN, CCRP;Kelly Hayes, BS, ACSM CEP, Exercise Physiologist;Noah Tickle, BS, Exercise Physiologist    Virtual Visit No    Medication changes reported     No    Fall or balance concerns reported    No    Warm-up and Cool-down Performed on first and last piece of equipment    Resistance Training Performed Yes    VAD Patient? No    PAD/SET Patient? No      Pain Assessment   Currently in Pain? No/denies                Social History   Tobacco Use  Smoking Status Former   Packs/day: 1.00   Years: 10.00   Total pack years: 10.00   Types: Cigarettes   Quit date: 12/07/1990   Years since quitting: 30.6  Smokeless Tobacco Never    Goals Met:  Independence with exercise equipment Exercise tolerated well No report of concerns or symptoms today  Goals Unmet:  Not Applicable  Comments: Pt able to follow exercise prescription today without complaint.  Will continue to monitor for progression.    Dr. Mark Miller is Medical Director for [facility].  Dr. Fuad Aleskerov is Medical Director for [facility] Pulmonary Rehabilitation. 

## 2021-08-07 DIAGNOSIS — I208 Other forms of angina pectoris: Secondary | ICD-10-CM

## 2021-08-07 NOTE — Progress Notes (Signed)
Daily Session Note  Patient Details  Name: Gregory Obrien MRN: [id number] Date of Birth: [birth year] Referring Provider:   Flowsheet Row Cardiac Rehab from 07/07/2021 in [facility] Cardiac and Pulmonary Rehab  Referring Provider [name] MD       Encounter Date: 08/07/2021  Check In:  Session Check In - 08/07/21 0929       Check-In   Supervising physician immediately available to respond to emergencies See telemetry face sheet for immediately available ER MD    Location [facility]-Cardiac & Pulmonary Rehab    Staff Present Antionette Fairy, BS, Exercise Physiologist;Susanne Bice, RN, BSN, CCRP;Melissa Gallitzin, RDN, Tawanna Solo, MS, ASCM CEP, Exercise Physiologist    Virtual Visit No    Medication changes reported     No    Fall or balance concerns reported    No    Warm-up and Cool-down Performed on first and last piece of equipment    Resistance Training Performed Yes    VAD Patient? No    PAD/SET Patient? No      Pain Assessment   Currently in Pain? No/denies    Multiple Pain Sites No                Social History   Tobacco Use  Smoking Status Former   Packs/day: 1.00   Years: 10.00   Total pack years: 10.00   Types: Cigarettes   Quit date: 12/07/1990   Years since quitting: 30.6  Smokeless Tobacco Never    Goals Met:  Independence with exercise equipment Exercise tolerated well No report of concerns or symptoms today  Goals Unmet:  Not Applicable  Comments: Pt able to follow exercise prescription today without complaint.  Will continue to monitor for progression.    Dr. Emily Filbert is Medical Director for [place].  Dr. Ottie Glazier is Medical Director for [facility] Pulmonary Rehabilitation.

## 2021-08-11 DIAGNOSIS — I2089 Other forms of angina pectoris: Secondary | ICD-10-CM

## 2021-08-11 DIAGNOSIS — I208 Other forms of angina pectoris: Secondary | ICD-10-CM

## 2021-08-13 ENCOUNTER — Encounter: Payer: Self-pay | Admitting: *Deleted

## 2021-08-13 DIAGNOSIS — I208 Other forms of angina pectoris: Secondary | ICD-10-CM

## 2021-08-13 NOTE — Progress Notes (Signed)
Cardiac Individual Treatment Plan  Patient Details  Name: Gregory Obrien MRN: [id number] Date of Birth: March 11, 1950 Referring Provider:   Flowsheet Row Cardiac Rehab from 07/07/2021 in [facility] Cardiac and Pulmonary Rehab  Referring Provider [name] MD       Initial Encounter Date:  Flowsheet Row Cardiac Rehab from 07/07/2021 in [facility] Cardiac and Pulmonary Rehab  Date 07/07/21       Visit Diagnosis: Chronic stable angina ([place])  Patient's Home Medications on Admission:  Current Outpatient Medications:    aspirin EC 81 MG tablet, Take 1 tablet (81 mg total) by mouth daily. Swallow whole., Disp: 360 tablet, Rfl: 0   cetirizine (ZYRTEC) 10 MG tablet, Take 5 mg by mouth every other day., Disp: , Rfl:    ezetimibe (ZETIA) 10 MG tablet, Take 1 tablet (10 mg total) by mouth daily., Disp: 90 tablet, Rfl: 3   famotidine (PEPCID) 20 MG tablet, Take 1 tablet (20 mg total) by mouth 2 (two) times daily., Disp: 60 tablet, Rfl: 5   isosorbide mononitrate (IMDUR) 30 MG 24 hr tablet, Take 1 tablet (30 mg total) by mouth daily., Disp: 30 tablet, Rfl: 3   metoprolol succinate (TOPROL XL) 25 MG 24 hr tablet, Take 0.5 tablets (12.5 mg total) by mouth daily., Disp: 15 tablet, Rfl: 11   rosuvastatin (CRESTOR) 40 MG tablet, Take 1 tablet (40 mg total) by mouth daily., Disp: 90 tablet, Rfl: 3   sildenafil (REVATIO) 20 MG tablet, 2-5 tabs 1 hour prior to intercourse, Disp: 50 tablet, Rfl: 11  Past Medical History: Past Medical History:  Diagnosis Date   Blood in stool    H/O arthroscopy of knee    History of colon polyps    UC (ulcerative colitis) ([place])    Ulcerative colitis ([place])    remission since 2017, see Dr. Willow Ora    Tobacco Use: Social History   Tobacco Use  Smoking Status Former   Packs/day: 1.00   Years: 10.00   Total pack years: 10.00   Types: Cigarettes   Quit date: 12/07/1990   Years since quitting: 30.7  Smokeless Tobacco Never    Labs: Review Flowsheet  More data exists       Latest Ref Rng & Units 09/24/2017 10/28/2018 02/06/2020 05/08/2021 06/24/2021  Labs for ITP Cardiac and Pulmonary Rehab  Cholestrol 0 - 200 mg/dL 217  286  201  237  192   LDL (calc) 0 - 99 mg/dL 144  204  125  - -  Direct LDL mg/dL - - - 169.0  114.0   HDL-C >39.00 mg/dL 36.70  48.20  42.30  42.50  37.90   Trlycerides 0.0 - 149.0 mg/dL 183.0  171.0  168.0  391.0  271.0   Hemoglobin A1c 4.6 - 6.5 % 6.1  6.1  6.2  - -     Exercise Target Goals: Exercise Program Goal: Individual exercise prescription set using results from initial 6 min walk test and THRR while considering  patient's activity barriers and safety.   Exercise Prescription Goal: Initial exercise prescription builds to 30-45 minutes a day of aerobic activity, 2-3 days per week.  Home exercise guidelines will be given to patient during program as part of exercise prescription that the participant will acknowledge.   Education: Aerobic Exercise: - Group verbal and visual presentation on the components of exercise prescription. Introduces F.I.T.T principle from ACSM for exercise prescriptions.  Reviews F.I.T.T. principles of aerobic exercise including progression. Written material given at graduation. [place] [facility]  from 08/07/2021 in [facility] Cardiac and Pulmonary Rehab  Date 07/10/21  Educator [facility]  Instruction Review Code 1- Verbalizes Understanding       Education: Resistance Exercise: - Group verbal and visual presentation on the components of exercise prescription. Introduces F.I.T.T principle from ACSM for exercise prescriptions  Reviews F.I.T.T. principles of resistance exercise including progression. Written material given at graduation. Flowsheet Row Cardiac Rehab from 08/07/2021 in [facility] Cardiac and Pulmonary Rehab  Date 07/24/21  Educator [facility]        Education: Exercise & Equipment Safety: - Individual verbal instruction and demonstration of equipment use and safety with use of the equipment. Flowsheet  Row Cardiac Rehab from 08/07/2021 in [facility] Cardiac and Pulmonary Rehab  Date 07/07/21  Educator [place]  Instruction Review Code 1- Verbalizes Understanding       Education: Exercise Physiology & General Exercise Guidelines: - Group verbal and written instruction with models to review the exercise physiology of the cardiovascular system and associated critical values. Provides general exercise guidelines with specific guidelines to those with heart or lung disease.    Education: Flexibility, Balance, Mind/Body Relaxation: - Group verbal and visual presentation with interactive activity on the components of exercise prescription. Introduces F.I.T.T principle from ACSM for exercise prescriptions. Reviews F.I.T.T. principles of flexibility and balance exercise training including progression. Also discusses the mind body connection.  Reviews various relaxation techniques to help reduce and manage stress (i.e. Deep breathing, progressive muscle relaxation, and visualization). Balance handout provided to take home. Written material given at graduation. Flowsheet Row Cardiac Rehab from 08/07/2021 in [facility] Cardiac and Pulmonary Rehab  Date 07/24/21  Educator [facility]  Instruction Review Code 1- Verbalizes Understanding       Activity Barriers & Risk Stratification:  Activity Barriers & Cardiac Risk Stratification - 07/07/21 1138       Activity Barriers & Cardiac Risk Stratification   Activity Barriers Back Problems;Deconditioning;Muscular Weakness;Shortness of Breath;Joint Problems;Chest Pain/Angina   back surgery, bilateral knee arthoscopy   Cardiac Risk Stratification Moderate             6 Minute Walk:  6 Minute Walk     Row Name 07/07/21 1137         6 Minute Walk   Phase Initial     Distance 1270 feet     Walk Time 6 minutes     # of Rest Breaks 0     MPH 2.41     METS 2.74     RPE 7     Perceived Dyspnea  1     VO2 Peak 9.57     Symptoms Yes (comment)     Comments SOB, hips sore  wiht walking 1/10, no chest pain     Resting HR 60 bpm     Resting BP 116/52     Resting Oxygen Saturation  94 %     Exercise Oxygen Saturation  during 6 min walk 94 %     Max Ex. HR 83 bpm     Max Ex. BP 126/70     2 Minute Post BP 116/62              Oxygen Initial Assessment:   Oxygen Re-Evaluation:   Oxygen Discharge (Final Oxygen Re-Evaluation):   Initial Exercise Prescription:  Initial Exercise Prescription - 07/07/21 1100       Date of Initial Exercise RX and Referring Provider   Date 07/07/21    Referring Provider [name] MD  Oxygen   Maintain Oxygen Saturation 88% or higher      Treadmill   MPH 2.3    Grade 0.5    Minutes 15    METs 2.92      Elliptical   Level 1    Speed 2.5    Minutes 15    METs 2      REL-XR   Level 1    Speed 50    Minutes 15    METs 2.5      Prescription Details   Frequency (times per week) 2    Duration Progress to 30 minutes of continuous aerobic without signs/symptoms of physical distress      Intensity   THRR 40-80% of Max Heartrate 96-133    Ratings of Perceived Exertion 11-13    Perceived Dyspnea 0-4      Progression   Progression Continue to progress workloads to maintain intensity without signs/symptoms of physical distress.      Resistance Training   Training Prescription Yes    Weight 4 lb    Reps 10-15             Perform Capillary Blood Glucose checks as needed.  Exercise Prescription Changes:   Exercise Prescription Changes     Row Name 07/07/21 1100 07/16/21 1200 07/22/21 1000 07/30/21 1300       Response to Exercise   Blood Pressure (Admit) 116/52 122/60 -- 118/58    Blood Pressure (Exercise) 126/70 136/62 -- 120/70    Blood Pressure (Exit) 116/62 112/56 -- 110/62    Heart Rate (Admit) 60 bpm 55 bpm -- 78 bpm    Heart Rate (Exercise) 83 bpm 106 bpm -- 93 bpm    Heart Rate (Exit) 58 bpm 68 bpm -- 67 bpm    Oxygen Saturation (Admit) 94 % -- -- --    Oxygen Saturation  (Exercise) 94 % -- -- --    Rating of Perceived Exertion (Exercise) 7 15 -- 13    Perceived Dyspnea (Exercise) 1 -- -- --    Symptoms SOB, hips sore 1/10 none -- none    Comments walk test results second full day of exercise -- --    Duration -- Progress to 30 minutes of  aerobic without signs/symptoms of physical distress Progress to 30 minutes of  aerobic without signs/symptoms of physical distress Continue with 30 min of aerobic exercise without signs/symptoms of physical distress.    Intensity -- THRR unchanged THRR unchanged THRR unchanged      Progression   Progression -- Continue to progress workloads to maintain intensity without signs/symptoms of physical distress. Continue to progress workloads to maintain intensity without signs/symptoms of physical distress. Continue to progress workloads to maintain intensity without signs/symptoms of physical distress.    Average METs -- 2.92 2.92 4.3      Resistance Training   Training Prescription -- Yes Yes Yes    Weight -- 4 lb 4 lb 4 lb    Reps -- 10-15 10-15 10-15      Interval Training   Interval Training -- No No No      Treadmill   MPH -- 2.3 2.3 2.9    Grade -- 0.5 0.5 6    Minutes -- 15 15 15     METs -- 2.92 2.92 5.62      NuStep   Level -- -- -- 4    Minutes -- -- -- 15    METs -- -- -- 3.4  Elliptical   Level -- 1 1 --    Speed -- 2.5 2.5 --    Minutes -- 15 15 --      REL-XR   Level -- -- -- 3    Minutes -- -- -- 15    METs -- -- -- 3.1      Biostep-RELP   Level -- -- -- 4    Minutes -- -- -- 15    METs -- -- -- 4      Home Exercise Plan   Plans to continue exercise at -- -- [employer] (comment)  walking, swimming, exercise equipment [employer] (comment)  walking, swimming, exercise equipment    Frequency -- -- Add 3 additional days to program exercise sessions. Add 3 additional days to program exercise sessions.    Initial Home Exercises Provided -- -- 07/22/21 07/22/21      Oxygen    Maintain Oxygen Saturation -- 88% or higher 88% or higher 88% or higher             Exercise Comments:   Exercise Comments     Row Name 07/10/21 0934           Exercise Comments First full day of exercise!  Patient was oriented to gym and equipment including functions, settings, policies, and procedures.  Patient's individual exercise prescription and treatment plan were reviewed.  All starting workloads were established based on the results of the 6 minute walk test done at initial orientation visit.  The plan for exercise progression was also introduced and progression will be customized based on patient's performance and goals.                Exercise Goals and Review:   Exercise Goals     Row Name 07/07/21 1140             Exercise Goals   Increase Physical Activity Yes       Intervention Provide advice, education, support and counseling about physical activity/exercise needs.;Develop an individualized exercise prescription for aerobic and resistive training based on initial evaluation findings, risk stratification, comorbidities and participant's personal goals.       Expected Outcomes Short Term: Attend rehab on a regular basis to increase amount of physical activity.;Long Term: Exercising regularly at least 3-5 days a week.;Long Term: Add in home exercise to make exercise part of routine and to increase amount of physical activity.       Increase Strength and Stamina Yes       Intervention Provide advice, education, support and counseling about physical activity/exercise needs.;Develop an individualized exercise prescription for aerobic and resistive training based on initial evaluation findings, risk stratification, comorbidities and participant's personal goals.       Expected Outcomes Short Term: Perform resistance training exercises routinely during rehab and add in resistance training at home;Short Term: Increase workloads from initial exercise prescription for  resistance, speed, and METs.;Long Term: Improve cardiorespiratory fitness, muscular endurance and strength as measured by increased METs and functional capacity ( )       Able to understand and use rate of perceived exertion (RPE) scale Yes       Intervention Provide education and explanation on how to use RPE scale       Expected Outcomes Short Term: Able to use RPE daily in rehab to express subjective intensity level;Long Term:  Able to use RPE to guide intensity level when exercising independently       Able to understand and use  Dyspnea scale Yes       Intervention Provide education and explanation on how to use Dyspnea scale       Expected Outcomes Short Term: Able to use Dyspnea scale daily in rehab to express subjective sense of shortness of breath during exertion;Long Term: Able to use Dyspnea scale to guide intensity level when exercising independently       Knowledge and understanding of Target Heart Rate Range (THRR) Yes       Intervention Provide education and explanation of THRR including how the numbers were predicted and where they are located for reference       Expected Outcomes Short Term: Able to use daily as guideline for intensity in rehab;Short Term: Able to state/look up THRR;Long Term: Able to use THRR to govern intensity when exercising independently       Able to check pulse independently Yes       Intervention Provide education and demonstration on how to check pulse in carotid and radial arteries.;Review the importance of being able to check your own pulse for safety during independent exercise       Expected Outcomes Short Term: Able to explain why pulse checking is important during independent exercise;Long Term: Able to check pulse independently and accurately       Understanding of Exercise Prescription Yes       Intervention Provide education, explanation, and written materials on patient's individual exercise prescription       Expected Outcomes Short Term: Able to  explain program exercise prescription;Long Term: Able to explain home exercise prescription to exercise independently                Exercise Goals Re-Evaluation :  Exercise Goals Re-Evaluation     Row Name 07/10/21 0934 07/16/21 1247 07/22/21 1009 07/30/21 1329       Exercise Goal Re-Evaluation   Exercise Goals Review Increase Physical Activity;Able to understand and use rate of perceived exertion (RPE) scale;Knowledge and understanding of Target Heart Rate Range (THRR);Understanding of Exercise Prescription;Able to check pulse independently;Able to understand and use Dyspnea scale;Increase Strength and Stamina Increase Physical Activity;Increase Strength and Stamina;Understanding of Exercise Prescription Increase Strength and Stamina;Increase Physical Activity;Able to understand and use rate of perceived exertion (RPE) scale;Able to understand and use Dyspnea scale;Knowledge and understanding of Target Heart Rate Range (THRR);Able to check pulse independently;Understanding of Exercise Prescription Increase Physical Activity;Increase Strength and Stamina;Understanding of Exercise Prescription    Comments Reviewed RPE and dyspnea scales, THR and program prescription with pt today.  Pt voiced understanding and was given a copy of goals to take home. [place] is off to a good start in rehab.  He has completed the first two full days of exercise and has tackled the elliptical right out the gate.  We will continue to monitor his progress. Reviewed home exercise with pt today.  Pt plans to walk, swim, and use his exercise equipment for exercise.  Reviewed THR, pulse, RPE, sign and symptoms, pulse oximetery and when to call 911 or MD.  Also discussed weather considerations and indoor options.  Pt voiced understanding. [place] is doing well in rehab.  He is up to 2.9 mph on treadmill and incline vaires from 3% to 6% grade.  We will continue to montior his progress.    Expected Outcomes Short: Use RPE daily to  regulate intensity. Long: Follow program prescription in THR. Short: Continue to attend rehab regularly Long: Continue to follow program prescription Short: add 2-3 days of exercies at  home on off days of cardiac rehab class. Long: Become independent with exercise routine upon graduation from cardiac rehab. Short: Continue to climb on treadmill Long: Continue to improve stamina             Discharge Exercise Prescription (Final Exercise Prescription Changes):  Exercise Prescription Changes - 07/30/21 1300       Response to Exercise   Blood Pressure (Admit) 118/58    Blood Pressure (Exercise) 120/70    Blood Pressure (Exit) 110/62    Heart Rate (Admit) 78 bpm    Heart Rate (Exercise) 93 bpm    Heart Rate (Exit) 67 bpm    Rating of Perceived Exertion (Exercise) 13    Symptoms none    Duration Continue with 30 min of aerobic exercise without signs/symptoms of physical distress.    Intensity THRR unchanged      Progression   Progression Continue to progress workloads to maintain intensity without signs/symptoms of physical distress.    Average METs 4.3      Resistance Training   Training Prescription Yes    Weight 4 lb    Reps 10-15      Interval Training   Interval Training No      Treadmill   MPH 2.9    Grade 6    Minutes 15    METs 5.62      NuStep   Level 4    Minutes 15    METs 3.4      REL-XR   Level 3    Minutes 15    METs 3.1      Biostep-RELP   Level 4    Minutes 15    METs 4      Home Exercise Plan   Plans to continue exercise at [employer] (comment)   walking, swimming, exercise equipment   Frequency Add 3 additional days to program exercise sessions.    Initial Home Exercises Provided 07/22/21      Oxygen   Maintain Oxygen Saturation 88% or higher             Nutrition:  Target Goals: Understanding of nutrition guidelines, daily intake of sodium 1500mg , cholesterol 200mg , calories 30% from fat and 7% or less from saturated  fats, daily to have 5 or more servings of fruits and vegetables.  Education: All About Nutrition: -Group instruction provided by verbal, written material, interactive activities, discussions, models, and posters to present general guidelines for heart healthy nutrition including fat, fiber, MyPlate, the role of sodium in heart healthy nutrition, utilization of the nutrition label, and utilization of this knowledge for meal planning. Follow up email sent as well. Written material given at graduation. Flowsheet Row Cardiac Rehab from 08/07/2021 in [facility] Cardiac and Pulmonary Rehab  Education need identified 07/07/21  Date 07/31/21  Educator [place]  Instruction Review Code 1- Verbalizes Understanding       Biometrics:  Pre Biometrics - 07/07/21 1140       Pre Biometrics   Height 5' 7.1" (1.704 m)    Weight 172 lb 11.2 oz (78.3 kg)    BMI (Calculated) 26.98    Single Leg Stand 23.5 seconds              Nutrition Therapy Plan and Nutrition Goals:  Nutrition Therapy & Goals - 08/04/21 0946       Nutrition Therapy   Diet Heart healthy, low Na    Drug/Food Interactions Statins/Certain Fruits    Protein (specify units) 65-70g  Fiber 30 grams   as tolerated   Whole Grain Foods 1 servings   ideally 3, however, grains can cause GI distress for him   Saturated Fats 16 max. grams    Fruits and Vegetables 8 servings/day   as tolerated   Sodium 2 grams      Personal Nutrition Goals   Nutrition Goal ST: practice MyPlate guidelines, continue to vary plant foods in meals LT: continue to maintain heart healthy changes made after hospital visit, eat a variety of plant foods as tolerated    Comments 71 y.o. M admitted to cardiac rehab for chrinc stable angina. PMHx includes CAD, GERD, prediabetes, HLD, former smoker, IBD (Ulcerative Colitis), COVID (Jan 2023), AAA. Relevant medications includes pepcid, crestor. PYP Score: 72. Vegetables & Fruits 7/12. Breads, Grains & Cereals 9/12. Red & Processed  Meat 11/12. Poultry 2/2. Fish & Shellfish 1/4. Beans, Nuts & Seeds 1/4. Milk & Dairy Foods 5/6. Toppings, Oils, Seasonings & Salt 17/20. Sweets, Snacks & Restaurant Food 9/14. Beverages 10/10. Giorgio reports that since his hospital visit and has made changes. He has limited red meat and processed meat, when he gets red meat, he gets lean meat. He has limited cheese as well. B: 6/630: whole grain cereal (almond/nut cereal) with blueberries with coffee (black) L: english muffin (whole wheat and regular) with almond butter and sometimes soup (limiting salt) D: varies - last night they had [place] burgers with tater tots. Drinks: water - he reports he has starting drinking more. He has one beer with dinner. He reports he has had UC for 15 years and has not had a flare up. He reports beans and lentils as well as whole grains give him GI distress. He has been using avocado oil margarine instead of butter, but uses avocado oil for everything else. Reviewed heart healthy eating from education last week.      Intervention Plan   Intervention Prescribe, educate and counsel regarding individualized specific dietary modifications aiming towards targeted core components such as weight, hypertension, lipid management, diabetes, heart failure and other comorbidities.    Expected Outcomes Short Term Goal: Understand basic principles of dietary content, such as calories, fat, sodium, cholesterol and nutrients.;Short Term Goal: A plan has been developed with personal nutrition goals set during dietitian appointment.;Long Term Goal: Adherence to prescribed nutrition plan.             Nutrition Assessments:  MEDIFICTS Score Key: ?70 Need to make dietary changes  40-70 Heart Healthy Diet ? 40 Therapeutic Level Cholesterol Diet  Flowsheet Row Cardiac Rehab from 07/17/2021 in [facility] Cardiac and Pulmonary Rehab  Picture Your Plate Total Score on Admission 72      Picture Your Plate Scores: <32 Unhealthy dietary pattern  with much room for improvement. 41-50 Dietary pattern unlikely to meet recommendations for good health and room for improvement. 51-60 More healthful dietary pattern, with some room for improvement.  >60 Healthy dietary pattern, although there may be some specific behaviors that could be improved.    Nutrition Goals Re-Evaluation:  Nutrition Goals Re-Evaluation     Row Name 07/24/21 0940             Goals   Nutrition Goal Meet with dietitian       Comment Needs to set up appt. He has changed his eating appointments.       Expected Outcome Set up appointment with dietitian                Nutrition  Goals Discharge (Final Nutrition Goals Re-Evaluation):  Nutrition Goals Re-Evaluation - 07/24/21 0940       Goals   Nutrition Goal Meet with dietitian    Comment Needs to set up appt. He has changed his eating appointments.    Expected Outcome Set up appointment with dietitian             Psychosocial: Target Goals: Acknowledge presence or absence of significant depression and/or stress, maximize coping skills, provide positive support system. Participant is able to verbalize types and ability to use techniques and skills needed for reducing stress and depression.   Education: Stress, Anxiety, and Depression - Group verbal and visual presentation to define topics covered.  Reviews how body is impacted by stress, anxiety, and depression.  Also discusses healthy ways to reduce stress and to treat/manage anxiety and depression.  Written material given at graduation.   Education: Sleep Hygiene -Provides group verbal and written instruction about how sleep can affect your health.  Define sleep hygiene, discuss sleep cycles and impact of sleep habits. Review good sleep hygiene tips.    Initial Review & Psychosocial Screening:  Initial Psych Review & Screening - 06/26/21 0913       Initial Review   Current issues with None Identified      Family Dynamics   Good Support  System? Yes   wife, curch     Barriers   Psychosocial barriers to participate in program There are no identifiable barriers or psychosocial needs.      Screening Interventions   Interventions Encouraged to exercise;To provide support and resources with identified psychosocial needs;Provide feedback about the scores to participant    Expected Outcomes Short Term goal: Utilizing psychosocial counselor, staff and physician to assist with identification of specific Stressors or current issues interfering with healing process. Setting desired goal for each stressor or current issue identified.;Long Term Goal: Stressors or current issues are controlled or eliminated.;Short Term goal: Identification and review with participant of any Quality of Life or Depression concerns found by scoring the questionnaire.;Long Term goal: The participant improves quality of Life and PHQ9 Scores as seen by post scores and/or verbalization of changes             Quality of Life Scores:   Quality of Life - 07/07/21 1141       Quality of Life   Select Quality of Life      Quality of Life Scores   Health/Function Pre 21.6 %    Socioeconomic Pre 30 %    Psych/Spiritual Pre 29.14 %    Family Pre 30 %    GLOBAL Pre 26.23 %            Scores of 19 and below usually indicate a poorer quality of life in these areas.  A difference of  2-3 points is a clinically meaningful difference.  A difference of 2-3 points in the total score of the Quality of Life Index has been associated with significant improvement in overall quality of life, self-image, physical symptoms, and general health in studies assessing change in quality of life.  PHQ-9: Review Flowsheet  More data may exist      07/07/2021 02/06/2020 10/28/2018 09/29/2017 03/29/2017  Depression screen PHQ 2/9  Decreased Interest 0 0 0 0 0  Down, Depressed, Hopeless 0 0 0 0 0  PHQ - 2 Score 0 0 0 0 0  Altered sleeping 1 2 - - -  Tired, decreased energy 1 1 - -  -  Change in appetite 0 0 - - -  Feeling bad or failure about yourself  0 0 - - -  Trouble concentrating 0 0 - - -  Moving slowly or fidgety/restless 0 0 - - -  Suicidal thoughts 0 0 - - -  PHQ-9 Score 2 3 - - -  Difficult doing work/chores Not difficult at all - - - -   Interpretation of Total Score  Total Score Depression Severity:  1-4 = Minimal depression, 5-9 = Mild depression, 10-14 = Moderate depression, 15-19 = Moderately severe depression, 20-27 = Severe depression   Psychosocial Evaluation and Intervention:  Psychosocial Evaluation - 06/26/21 0923       Psychosocial Evaluation & Interventions   Interventions Encouraged to exercise with the program and follow exercise prescription    Comments Nihit has no barriers to attending the program. He is ready to learn about amaginf his life to decrease symptoms. He lives with his wife and their dog. He has 7 children combined with his second wife. He has 3 out of the home and 4 children living with his ex-wife in [place].  He is ready to get started.    Expected Outcomes [facility] will attend all scheduled sessions and the education sessions. He will learn more about caring for himself and living with heart disease. LTG chikezie wittmer the tools and resources form the program after discharge to continue to care for himself in a heart healthy manner.    Continue Psychosocial Services  Follow up required by staff             Psychosocial Re-Evaluation:  Psychosocial Re-Evaluation     [place] Name 07/24/21 [phone]             Psychosocial Re-Evaluation   Current issues with Current Stress Concerns       Comments Kuan is doing well in rehab.  He is planning to go to [employer] at some point to move his mother into assisted living.  He will exercise while up there.  He is not sleeping well.  He will wake up in the middle of the night and has trouble to get back to sleep.  He does use melatonin to go to sleep, but it doesn't keep him  asleep.  We talked about possibly bringing up it up with his doctor.  He does nap in the afternoon to help.       Expected Outcomes Short: Talk to doctor about sleep and get mother moved.  Long: Conitnue to cope with sleep and mother.       Interventions Encouraged to attend Cardiac Rehabilitation for the exercise;Stress management education       Continue Psychosocial Services  Follow up required by staff                Psychosocial Discharge (Final Psychosocial Re-Evaluation):  Psychosocial Re-Evaluation - 07/24/21 0941       Psychosocial Re-Evaluation   Current issues with Current Stress Concerns    Comments Maasai is doing well in rehab.  He is planning to go to [employer] at some point to move his mother into assisted living.  He will exercise while up there.  He is not sleeping well.  He will wake up in the middle of the night and has trouble to get back to sleep.  He does use melatonin to go to sleep, but it doesn't keep him asleep.  We talked about possibly bringing up it up with his  doctor.  He does nap in the afternoon to help.    Expected Outcomes Short: Talk to doctor about sleep and get mother moved.  Long: Conitnue to cope with sleep and mother.    Interventions Encouraged to attend Cardiac Rehabilitation for the exercise;Stress management education    Continue Psychosocial Services  Follow up required by staff             Vocational Rehabilitation: Provide vocational rehab assistance to qualifying candidates.   Vocational Rehab Evaluation & Intervention:   Education: Education Goals: Education classes will be provided on a variety of topics geared toward better understanding of heart health and risk factor modification. Participant will state understanding/return demonstration of topics presented as noted by education test scores.  Learning Barriers/Preferences:   General Cardiac Education Topics:  AED/CPR: - Group verbal and written instruction with the use of  models to demonstrate the basic use of the AED with the basic ABC's of resuscitation.   Anatomy and Cardiac Procedures: - Group verbal and visual presentation and models provide information about basic cardiac anatomy and function. Reviews the testing methods done to diagnose heart disease and the outcomes of the test results. Describes the treatment choices: Medical Management, Angioplasty, or Coronary Bypass Surgery for treating various heart conditions including Myocardial Infarction, Angina, Valve Disease, and Cardiac Arrhythmias.  Written material given at graduation. Flowsheet Row Cardiac Rehab from 08/07/2021 in [facility] Cardiac and Pulmonary Rehab  Education need identified 07/07/21  Date 07/17/21  Educator SB  Instruction Review Code 1- Verbalizes Understanding       Medication Safety: - Group verbal and visual instruction to review commonly prescribed medications for heart and lung disease. Reviews the medication, class of the drug, and side effects. Includes the steps to properly store meds and maintain the prescription regimen.  Written material given at graduation.   Intimacy: - Group verbal instruction through game format to discuss how heart and lung disease can affect sexual intimacy. Written material given at graduation.. Flowsheet Row Cardiac Rehab from 08/07/2021 in [facility] Cardiac and Pulmonary Rehab  Date 07/10/21  Educator [facility]  Instruction Review Code 1- Verbalizes Understanding       Know Your Numbers and Heart Failure: - Group verbal and visual instruction to discuss disease risk factors for cardiac and pulmonary disease and treatment options.  Reviews associated critical values for Overweight/Obesity, Hypertension, Cholesterol, and Diabetes.  Discusses basics of heart failure: signs/symptoms and treatments.  Introduces Heart Failure Zone chart for action plan for heart failure.  Written material given at graduation. Flowsheet Row Cardiac Rehab from 08/07/2021 in [facility] Cardiac  and Pulmonary Rehab  Education need identified 07/07/21       Infection Prevention: - Provides verbal and written material to individual with discussion of infection control including proper hand washing and proper equipment cleaning during exercise session. Flowsheet Row Cardiac Rehab from 08/07/2021 in [facility] Cardiac and Pulmonary Rehab  Date 07/07/21  Educator [facility]  Instruction Review Code 1- Verbalizes Understanding       Falls Prevention: - Provides verbal and written material to individual with discussion of falls prevention and safety. Flowsheet Row Cardiac Rehab from 08/07/2021 in [facility] Cardiac and Pulmonary Rehab  Date 06/26/21  Educator SB  Instruction Review Code 1- Verbalizes Understanding       Other: -Provides group and verbal instruction on various topics (see comments)   Knowledge Questionnaire Score:  Knowledge Questionnaire Score - 07/07/21 1143       Knowledge Questionnaire Score   Pre  Score 23/26             Core Components/Risk Factors/Patient Goals at Admission:  Personal Goals and Risk Factors at Admission - 07/07/21 1143       Core Components/Risk Factors/Patient Goals on Admission    Weight Management Yes;Weight Loss    Intervention Weight Management: Develop a combined nutrition and exercise program designed to reach desired caloric intake, while maintaining appropriate intake of nutrient and fiber, sodium and fats, and appropriate energy expenditure required for the weight goal.;Weight Management/Obesity: Establish reasonable short term and long term weight goals.;Weight Management: Provide education and appropriate resources to help participant work on and attain dietary goals.    Admit Weight 172 lb 11.2 oz (78.3 kg)    Goal Weight: Short Term 167 lb (75.8 kg)    Goal Weight: Long Term 160 lb (72.6 kg)    Expected Outcomes Short Term: Continue to assess and modify interventions until short term weight is achieved;Long Term: Adherence to  nutrition and physical activity/exercise program aimed toward attainment of established weight goal;Weight Loss: Understanding of general recommendations for a balanced deficit meal plan, which promotes 1-2 lb weight loss per week and includes a negative energy balance of [phone] kcal/d;Understanding recommendations for meals to include 15-35% energy as protein, 25-35% energy from fat, 35-60% energy from carbohydrates, less than 200mg  of dietary cholesterol, 20-35 gm of total fiber daily;Understanding of distribution of calorie intake throughout the day with the consumption of 4-5 meals/snacks    Lipids Yes    Intervention Provide education and support for participant on nutrition & aerobic/resistive exercise along with prescribed medications to achieve LDL 70mg , HDL >40mg .    Expected Outcomes Short Term: Participant states understanding of desired cholesterol values and is compliant with medications prescribed. Participant is following exercise prescription and nutrition guidelines.;Long Term: Cholesterol controlled with medications as prescribed, with individualized exercise RX and with personalized nutrition plan. Value goals: LDL < 70mg , HDL > 40 mg.             Education:Diabetes - Individual verbal and written instruction to review signs/symptoms of diabetes, desired ranges of glucose level fasting, after meals and with exercise. Acknowledge that pre and post exercise glucose checks will be done for 3 sessions at entry of program.   Core Components/Risk Factors/Patient Goals Review:   Goals and Risk Factor Review     Row Name 07/24/21 0938             Core Components/Risk Factors/Patient Goals Review   Personal Goals Review Weight Management/Obesity;Lipids;Hypertension       Review Cristyan is doing well in rehab.  His weight is holding steady.  His pressures are doing well.  He is doing well with his medications.  He has switched to taking his meds at night to help avoid headaches  during the day.       Expected Outcomes Short: Continue to keep eye on weight Long: Conitnue to monitor risk factors.                Core Components/Risk Factors/Patient Goals at Discharge (Final Review):   Goals and Risk Factor Review - 07/24/21 0938       Core Components/Risk Factors/Patient Goals Review   Personal Goals Review Weight Management/Obesity;Lipids;Hypertension    Review Byntlee is doing well in rehab.  His weight is holding steady.  His pressures are doing well.  He is doing well with his medications.  He has switched to taking his meds at night to help  avoid headaches during the day.    Expected Outcomes Short: Continue to keep eye on weight Long: Conitnue to monitor risk factors.             ITP Comments:  ITP Comments     Row Name 06/26/21 0931 07/07/21 1137 07/10/21 0934 07/16/21 1025 08/04/21 1018   ITP Comments irtual orientation call completed today. he has an appointment on Date: 07/07/2021  for EP eval and gym Orientation.  Documentation of diagnosis can be found in [facility]  Date: 06/18/2021 . Completed 6MWT and gym orientation. Initial ITP created and sent for review to Dr. Zetta Bills, Medical Director. First full day of exercise!  Patient was oriented to gym and equipment including functions, settings, policies, and procedures.  Patient's individual exercise prescription and treatment plan were reviewed.  All starting workloads were established based on the results of the 6 minute walk test done at initial orientation visit.  The plan for exercise progression was also introduced and progression will be customized based on patient's performance and goals. 30 Day review completed. Medical Director ITP review done, changes made as directed, and signed approval by Medical Director.   NEW Completed initial RD Consultation    [place] Name 08/11/21 1353 08/13/21 0805         ITP Comments Patient called to let us know he will be out 4-5 weeks in order to move his Mother  in law into a nursing home, which requires a lot of travel. Will place on hold- patient will update [place] what day he will return back. 30 Day review completed. Medical Director ITP review done, changes made as directed, and signed approval by Medical Director.  on hold family concern               Comments:

## 2021-08-22 ENCOUNTER — Other Ambulatory Visit (HOSPITAL_BASED_OUTPATIENT_CLINIC_OR_DEPARTMENT_OTHER): Payer: Self-pay

## 2021-08-22 ENCOUNTER — Telehealth: Payer: Self-pay

## 2021-08-22 MED ORDER — METOPROLOL SUCCINATE ER 25 MG PO TB24
12.5000 mg | ORAL_TABLET | Freq: Every day | ORAL | 1 refills | Status: DC
Start: 2021-08-22 — End: 2021-12-22

## 2021-08-22 MED ORDER — ISOSORBIDE MONONITRATE ER 30 MG PO TB24: 15.0000 mg | ORAL_TABLET | Freq: Every day | ORAL | 1 refills | Status: DC

## 2021-08-22 MED ORDER — EZETIMIBE 10 MG PO TABS: 10.0000 mg | ORAL_TABLET | Freq: Every day | ORAL | 3 refills | Status: DC

## 2021-08-22 NOTE — Telephone Encounter (Signed)
Refill request sent from [facility] mail pharmacy for Ezetimibe and Isosorbide. Refills sent to pharmacy.

## 2021-08-28 ENCOUNTER — Other Ambulatory Visit: Payer: Self-pay | Admitting: Cardiovascular Disease

## 2021-08-28 NOTE — Telephone Encounter (Signed)
I apologize if I'm sending this to the wrong nurse. I figured I send to triage since there isn't a designated Engineer, civil (consulting) for [employer] at this time. Please see note and advise for Isosorbide 30 mg tablet.

## 2021-09-05 DIAGNOSIS — I208 Other forms of angina pectoris: Secondary | ICD-10-CM

## 2021-09-05 NOTE — Progress Notes (Signed)
Patient not attended since 08/07/2021.he is helping with his mother. Goals will be reviewed when patient returns and has been attending regularly.

## 2021-09-09 ENCOUNTER — Encounter: Payer: Self-pay | Admitting: *Deleted

## 2021-09-09 DIAGNOSIS — Z006 Encounter for examination for normal comparison and control in clinical research program: Secondary | ICD-10-CM

## 2021-09-09 NOTE — Research (Signed)
Spoke with Gregory Obrien about essence researhc. Scheduled him an appointment for Sept 19 at 0800.

## 2021-09-10 ENCOUNTER — Encounter: Payer: Self-pay | Admitting: *Deleted

## 2021-09-10 DIAGNOSIS — I208 Other forms of angina pectoris: Secondary | ICD-10-CM

## 2021-09-10 NOTE — Progress Notes (Signed)
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 Swallow whole., Disp: 360 tablet, Rfl: 0   cetirizine (ZYRTEC) 10 MG tablet, Take 5 mg by mouth every other day., Disp: , Rfl:    ezetimibe (ZETIA) 10 MG tablet, Take 1 tablet (10 mg total) by mouth daily., Disp: 90 tablet, Rfl: 3   famotidine (PEPCID) 20 MG tablet, Take 1 tablet (20 mg total) by mouth 2 (two) times daily., Disp: 60 tablet, Rfl: 5   isosorbide mononitrate (IMDUR) 30 MG 24 hr tablet, TAKE 1/2 TABLET EVERY DAY., Disp: 45 tablet, Rfl: 2   metoprolol succinate (TOPROL XL) 25 MG 24 hr tablet, Take 0.5 tablets (12.5 mg total) by mouth daily. Please keep scheduled appointment, Disp: 30 tablet, Rfl: 1   rosuvastatin (CRESTOR) 40 MG tablet, Take 1 tablet (40 mg total) by mouth daily., Disp: 90 tablet, Rfl: 3   sildenafil (REVATIO) 20 MG tablet, 2-5 tabs 1 hour prior to intercourse, Disp: 50 tablet, Rfl: 11  Past Medical History: Past Medical History:  Diagnosis Date   Blood in stool    H/O arthroscopy of knee    History of colon polyps    UC (ulcerative colitis) ([place])    Ulcerative colitis ([place])    remission since 2017, see Dr. Willow Ora    Tobacco Use: Social History   Tobacco Use  Smoking Status Former   Packs/day: 1.00   Years: 10.00   Total pack years: 10.00   Types: Cigarettes   Quit date: 12/07/1990   Years since quitting: 30.7  Smokeless Tobacco Never    Labs: Review Flowsheet   More data exists      Latest Ref Rng & Units 09/24/2017 10/28/2018 02/06/2020 05/08/2021 06/24/2021  Labs for ITP Cardiac and Pulmonary Rehab  Cholestrol 0 - 200 mg/dL 217  286  201  237  192   LDL (calc) 0 - 99 mg/dL 144  204  125  - -  Direct LDL mg/dL - - - 169.0  114.0   HDL-C >39.00 mg/dL 36.70  48.20  42.30  42.50  37.90   Trlycerides 0.0 - 149.0 mg/dL 183.0  171.0  168.0  391.0  271.0   Hemoglobin A1c 4.6 - 6.5 % 6.1  6.1  6.2  - -     Exercise Target Goals: Exercise Program Goal: Individual exercise prescription set using results from initial 6 min walk test and THRR while considering  patient's activity barriers and safety.   Exercise Prescription Goal: Initial exercise prescription builds to 30-45 minutes a day of aerobic activity, 2-3 days per week.  Home exercise guidelines will be given to patient during program as part of exercise prescription that the participant will acknowledge.   Education: Aerobic Exercise: - Group verbal and visual presentation on the components of exercise prescription. Introduces F.I.T.T principle from ACSM for exercise prescriptions.  Reviews F.I.T.T. principles of aerobic exercise including progression. Written material given at graduation. [place] [facility]  from 08/07/2021 in [facility] Cardiac and Pulmonary Rehab  Date 07/10/21  Educator [facility]  Instruction Review Code 1- Verbalizes Understanding       Education: Resistance Exercise: - Group verbal and visual presentation on the components of exercise prescription. Introduces F.I.T.T principle from ACSM for exercise prescriptions  Reviews F.I.T.T. principles of resistance exercise including progression. Written material given at graduation. Flowsheet Row Cardiac Rehab from 08/07/2021 in [facility] Cardiac and Pulmonary Rehab  Date 07/24/21  Educator [facility]        Education: Exercise & Equipment Safety: - Individual verbal instruction and demonstration of equipment use and safety with use of the  equipment. Flowsheet Row Cardiac Rehab from 08/07/2021 in [facility] Cardiac and Pulmonary Rehab  Date 07/07/21  Educator [facility]  Instruction Review Code 1- Verbalizes Understanding       Education: Exercise Physiology & General Exercise Guidelines: - Group verbal and written instruction with models to review the exercise physiology of the cardiovascular system and associated critical values. Provides general exercise guidelines with specific guidelines to those with heart or lung disease.    Education: Flexibility, Balance, Mind/Body Relaxation: - Group verbal and visual presentation with interactive activity on the components of exercise prescription. Introduces F.I.T.T principle from ACSM for exercise prescriptions. Reviews F.I.T.T. principles of flexibility and balance exercise training including progression. Also discusses the mind body connection.  Reviews various relaxation techniques to help reduce and manage stress (i.e. Deep breathing, progressive muscle relaxation, and visualization). Balance handout provided to take home. Written material given at graduation. Flowsheet Row Cardiac Rehab from 08/07/2021 in [facility] Cardiac and Pulmonary Rehab  Date 07/24/21  Educator [facility]  Instruction Review Code 1- Verbalizes Understanding       Activity Barriers & Risk Stratification:  Activity Barriers & Cardiac Risk Stratification - 07/07/21 1138       Activity Barriers & Cardiac Risk Stratification   Activity Barriers Back Problems;Deconditioning;Muscular Weakness;Shortness of Breath;Joint Problems;Chest Pain/Angina   back surgery, bilateral knee arthoscopy   Cardiac Risk Stratification Moderate             6 Minute Walk:  6 Minute Walk     Row Name 07/07/21 1137         6 Minute Walk   Phase Initial     Distance 1270 feet     Walk Time 6 minutes     # of Rest Breaks 0     MPH 2.41     METS 2.74     RPE 7     Perceived Dyspnea  1     VO2 Peak 9.57     Symptoms Yes (comment)      Comments SOB, hips sore wiht walking 1/10, no chest pain     Resting HR 60 bpm     Resting BP 116/52     Resting Oxygen Saturation  94 %     Exercise Oxygen Saturation  during 6 min walk 94 %     Max Ex. HR 83 bpm     Max Ex. BP 126/70     2 Minute Post BP 116/62              Oxygen Initial Assessment:   Oxygen Re-Evaluation:   Oxygen Discharge (Final Oxygen Re-Evaluation):   Initial Exercise Prescription:  Initial Exercise Prescription - 07/07/21 1100       Date of Initial Exercise RX and Referring Provider   Date 07/07/21    Referring Provider [name] MD  Oxygen   Maintain Oxygen Saturation 88% or higher      Treadmill   MPH 2.3    Grade 0.5    Minutes 15    METs 2.92      Elliptical   Level 1    Speed 2.5    Minutes 15    METs 2      REL-XR   Level 1    Speed 50    Minutes 15    METs 2.5      Prescription Details   Frequency (times per week) 2    Duration Progress to 30 minutes of continuous aerobic without signs/symptoms of physical distress      Intensity   THRR 40-80% of Max Heartrate 96-133    Ratings of Perceived Exertion 11-13    Perceived Dyspnea 0-4      Progression   Progression Continue to progress workloads to maintain intensity without signs/symptoms of physical distress.      Resistance Training   Training Prescription Yes    Weight 4 lb    Reps 10-15             Perform Capillary Blood Glucose checks as needed.  Exercise Prescription Changes:   Exercise Prescription Changes     Row Name 07/07/21 1100 07/16/21 1200 07/22/21 1000 07/30/21 1300 08/14/21 1500     Response to Exercise   Blood Pressure (Admit) 116/52 122/60 -- 118/58 122/80   Blood Pressure (Exercise) 126/70 136/62 -- 120/70 146/82   Blood Pressure (Exit) 116/62 112/56 -- 110/62 118/63   Heart Rate (Admit) 60 bpm 55 bpm -- 78 bpm 58 bpm   Heart Rate (Exercise) 83 bpm 106 bpm -- 93 bpm 100 bpm   Heart Rate (Exit) 58 bpm 68 bpm -- 67 bpm 60  bpm   Oxygen Saturation (Admit) 94 % -- -- -- --   Oxygen Saturation (Exercise) 94 % -- -- -- --   Rating of Perceived Exertion (Exercise) 7 15 -- 13 13   Perceived Dyspnea (Exercise) 1 -- -- -- --   Symptoms SOB, hips sore 1/10 none -- none none   Comments walk test results second full day of exercise -- -- --   Duration -- Progress to 30 minutes of  aerobic without signs/symptoms of physical distress Progress to 30 minutes of  aerobic without signs/symptoms of physical distress Continue with 30 min of aerobic exercise without signs/symptoms of physical distress. Continue with 30 min of aerobic exercise without signs/symptoms of physical distress.   Intensity -- THRR unchanged THRR unchanged THRR unchanged THRR unchanged     Progression   Progression -- Continue to progress workloads to maintain intensity without signs/symptoms of physical distress. Continue to progress workloads to maintain intensity without signs/symptoms of physical distress. Continue to progress workloads to maintain intensity without signs/symptoms of physical distress. Continue to progress workloads to maintain intensity without signs/symptoms of physical distress.   Average METs -- 2.92 2.92 4.3 4.25     Resistance Training   Training Prescription -- Yes Yes Yes Yes   Weight -- 4 lb 4 lb 4 lb 4 lb   Reps -- 10-15 10-15 10-15 10-15     Interval Training   Interval Training -- No No No No     Treadmill   MPH -- 2.3 2.3 2.9 3   Grade -- 0.5 0._0 Minutes -- _1 METs -- 2.92 2.92 5.62 5.78  NuStep   Level -- -- -- 4 4   Minutes -- -- -- 15 15   METs -- -- -- 3.4 3.9     Elliptical   Level -- 1 1 -- 1   Speed -- 2.5 2.5 -- 2.5   Minutes -- 15 15 -- 15     REL-XR   Level -- -- -- 3 --   Minutes -- -- -- 15 --   METs -- -- -- 3.1 --     Biostep-RELP   Level -- -- -- 4 --   Minutes -- -- -- 15 --   METs -- -- -- 4 --     Home Exercise Plan   Plans to continue exercise at -- --  [employer] (comment)  walking, swimming, exercise equipment [employer] (comment)  walking, swimming, exercise equipment [employer] (comment)  walking, swimming, exercise equipment   Frequency -- -- Add 3 additional days to program exercise sessions. Add 3 additional days to program exercise sessions. Add 3 additional days to program exercise sessions.   Initial Home Exercises Provided -- -- 07/22/21 07/22/21 07/22/21     Oxygen   Maintain Oxygen Saturation -- 88% or higher 88% or higher 88% or higher 88% or higher            Exercise Comments:   Exercise Comments     Row Name 07/10/21 0934           Exercise Comments First full day of exercise!  Patient was oriented to gym and equipment including functions, settings, policies, and procedures.  Patient's individual exercise prescription and treatment plan were reviewed.  All starting workloads were established based on the results of the 6 minute walk test done at initial orientation visit.  The plan for exercise progression was also introduced and progression will be customized based on patient's performance and goals.                Exercise Goals and Review:   Exercise Goals     Row Name 07/07/21 1140             Exercise Goals   Increase Physical Activity Yes       Intervention Provide advice, education, support and counseling about physical activity/exercise needs.;Develop an individualized exercise prescription for aerobic and resistive training based on initial evaluation findings, risk stratification, comorbidities and participant's personal goals.       Expected Outcomes Short Term: Attend rehab on a regular basis to increase amount of physical activity.;Long Term: Exercising regularly at least 3-5 days a week.;Long Term: Add in home exercise to make exercise part of routine and to increase amount of physical activity.       Increase Strength and Stamina Yes       Intervention Provide  advice, education, support and counseling about physical activity/exercise needs.;Develop an individualized exercise prescription for aerobic and resistive training based on initial evaluation findings, risk stratification, comorbidities and participant's personal goals.       Expected Outcomes Short Term: Perform resistance training exercises routinely during rehab and add in resistance training at home;Short Term: Increase workloads from initial exercise prescription for resistance, speed, and METs.;Long Term: Improve cardiorespiratory fitness, muscular endurance and strength as measured by increased METs and functional capacity (6MWT)       Able to understand and use rate of perceived exertion (RPE) scale Yes       Intervention Provide education and explanation on how to use RPE scale  Expected Outcomes Short Term: Able to use RPE daily in rehab to express subjective intensity level;Long Term:  Able to use RPE to guide intensity level when exercising independently       Able to understand and use Dyspnea scale Yes       Intervention Provide education and explanation on how to use Dyspnea scale       Expected Outcomes Short Term: Able to use Dyspnea scale daily in rehab to express subjective sense of shortness of breath during exertion;Long Term: Able to use Dyspnea scale to guide intensity level when exercising independently       Knowledge and understanding of Target Heart Rate Range (THRR) Yes       Intervention Provide education and explanation of THRR including how the numbers were predicted and where they are located for reference       Expected Outcomes Short Term: Able to use daily as guideline for intensity in rehab;Short Term: Able to state/look up THRR;Long Term: Able to use THRR to govern intensity when exercising independently       Able to check pulse independently Yes       Intervention Provide education and demonstration on how to check pulse in carotid and radial arteries.;Review  the importance of being able to check your own pulse for safety during independent exercise       Expected Outcomes Short Term: Able to explain why pulse checking is important during independent exercise;Long Term: Able to check pulse independently and accurately       Understanding of Exercise Prescription Yes       Intervention Provide education, explanation, and written materials on patient's individual exercise prescription       Expected Outcomes Short Term: Able to explain program exercise prescription;Long Term: Able to explain home exercise prescription to exercise independently                Exercise Goals Re-Evaluation :  Exercise Goals Re-Evaluation     Row Name 07/10/21 0934 07/16/21 1247 07/22/21 1009 07/30/21 1329 08/14/21 1512     Exercise Goal Re-Evaluation   Exercise Goals Review Increase Physical Activity;Able to understand and use rate of perceived exertion (RPE) scale;Knowledge and understanding of Target Heart Rate Range (THRR);Understanding of Exercise Prescription;Able to check pulse independently;Able to understand and use Dyspnea scale;Increase Strength and Stamina Increase Physical Activity;Increase Strength and Stamina;Understanding of Exercise Prescription Increase Strength and Stamina;Increase Physical Activity;Able to understand and use rate of perceived exertion (RPE) scale;Able to understand and use Dyspnea scale;Knowledge and understanding of Target Heart Rate Range (THRR);Able to check pulse independently;Understanding of Exercise Prescription Increase Physical Activity;Increase Strength and Stamina;Understanding of Exercise Prescription Increase Physical Activity;Increase Strength and Stamina;Understanding of Exercise Prescription   Comments Reviewed RPE and dyspnea scales, THR and program prescription with pt today.  Pt voiced understanding and was given a copy of goals to take home. Auther is off to a good start in rehab.  He has completed the first two full  days of exercise and has tackled the elliptical right out the gate.  We will continue to monitor his progress. Reviewed home exercise with pt today.  Pt plans to walk, swim, and use his exercise equipment for exercise.  Reviewed THR, pulse, RPE, sign and symptoms, pulse oximetery and when to call 911 or MD.  Also discussed weather considerations and indoor options.  Pt voiced understanding. Gregory Obrien is doing well in rehab.  He is up to 2.9 mph on treadmill and incline vaires from 3%  to 6% grade.  We will continue to montior his progress. Gregory Obrien is doing well in rehab. He is up to 3 mph on the treadmill while maintaining an incline of 6%. He has also done well tolerating level 4 on the T4 and level 5 on the recumbent bike. We will continue to montior his progress.   Expected Outcomes Short: Use RPE daily to regulate intensity. Long: Follow program prescription in THR. Short: Continue to attend rehab regularly Long: Continue to follow program prescription Short: add 2-3 days of exercies at home on off days of cardiac rehab class. Long: Become independent with exercise routine upon graduation from cardiac rehab. Short: Continue to climb on treadmill Long: Continue to improve stamina Short: Continue to improve workload on treadmill. Long: Continue to increase overall MET level.    [place] Name 08/26/21 1409             Exercise Goal Re-Evaluation   Exercise Goals Review Increase Physical Activity;Increase Strength and Stamina;Understanding of Exercise Prescription       Comments Gregory Obrien has not attended rehab since the last evaluation because he is helping move his family member out of state. We will continue to monitor his progress when he returns to the program.       Expected Outcomes Short: return to rehab. Long: Continue to increase strength and stamina.                Discharge Exercise Prescription (Final Exercise Prescription Changes):  Exercise Prescription Changes - 08/14/21 1500       Response to  Exercise   Blood Pressure (Admit) 122/80    Blood Pressure (Exercise) 146/82    Blood Pressure (Exit) 118/63    Heart Rate (Admit) 58 bpm    Heart Rate (Exercise) 100 bpm    Heart Rate (Exit) 60 bpm    Rating of Perceived Exertion (Exercise) 13    Symptoms none    Duration Continue with 30 min of aerobic exercise without signs/symptoms of physical distress.    Intensity THRR unchanged      Progression   Progression Continue to progress workloads to maintain intensity without signs/symptoms of physical distress.    Average METs 4.25      Resistance Training   Training Prescription Yes    Weight 4 lb    Reps 10-15      Interval Training   Interval Training No      Treadmill   MPH 3    Grade 6    Minutes 15    METs 5.78      NuStep   Level 4    Minutes 15    METs 3.9      Elliptical   Level 1    Speed 2.5    Minutes 15      Home Exercise Plan   Plans to continue exercise at [employer] (comment)   walking, swimming, exercise equipment   Frequency Add 3 additional days to program exercise sessions.    Initial Home Exercises Provided 07/22/21      Oxygen   Maintain Oxygen Saturation 88% or higher             Nutrition:  Target Goals: Understanding of nutrition guidelines, daily intake of sodium <1554m, cholesterol <2059m calories 30% from fat and 7% or less from saturated fats, daily to have 5 or more servings of fruits and vegetables.  Education: All About Nutrition: -Group instruction provided by verbal, written material, interactive activities, discussions, models,  and posters to present general guidelines for heart healthy nutrition including fat, fiber, MyPlate, the role of sodium in heart healthy nutrition, utilization of the nutrition label, and utilization of this knowledge for meal planning. Follow up email sent as well. Written material given at graduation. Flowsheet Row Cardiac Rehab from 08/07/2021 in [facility] Cardiac and Pulmonary Rehab   Education need identified 07/07/21  Date 07/31/21  Educator [place]  Instruction Review Code 1- Verbalizes Understanding       Biometrics:  Pre Biometrics - 07/07/21 1140       Pre Biometrics   Height 5' 7.1" (1.704 m)    Weight 172 lb 11.2 oz (78.3 kg)    BMI (Calculated) 26.98    Single Leg Stand 23.5 seconds              Nutrition Therapy Plan and Nutrition Goals:  Nutrition Therapy & Goals - 08/04/21 0946       Nutrition Therapy   Diet Heart healthy, low Na    Drug/Food Interactions Statins/Certain Fruits    Protein (specify units) 65-70g    Fiber 30 grams   as tolerated   Whole Grain Foods 1 servings   ideally 3, however, grains can cause GI distress for him   Saturated Fats 16 max. grams    Fruits and Vegetables 8 servings/day   as tolerated   Sodium 2 grams      Personal Nutrition Goals   Nutrition Goal ST: practice MyPlate guidelines, continue to vary plant foods in meals LT: continue to maintain heart healthy changes made after hospital visit, eat a variety of plant foods as tolerated    Comments 71 y.o. M admitted to cardiac rehab for chrinc stable angina. PMHx includes CAD, GERD, prediabetes, HLD, former smoker, IBD (Ulcerative Colitis), COVID (Jan 2023), AAA. Relevant medications includes pepcid, crestor. PYP Score: 72. Vegetables & Fruits 7/12. Breads, Grains & Cereals 9/12. Red & Processed Meat 11/12. Poultry 2/2. Fish & Shellfish 1/4. Beans, Nuts & Seeds 1/4. Milk & Dairy Foods 5/6. Toppings, Oils, Seasonings & Salt 17/20. Sweets, Snacks & Restaurant Food 9/14. Beverages 10/10. Gregory Obrien reports that since his hospital visit and has made changes. He has limited red meat and processed meat, when he gets red meat, he gets lean meat. He has limited cheese as well. B: 6/630: whole grain cereal (almond/nut cereal) with blueberries with coffee (black) L: english muffin (whole wheat and regular) with almond butter and sometimes soup (limiting salt) D: varies - last night they  had [place] burgers with tater tots. Drinks: water - he reports he has starting drinking more. He has one beer with dinner. He reports he has had UC for 15 years and has not had a flare up. He reports beans and lentils as well as whole grains give him GI distress. He has been using avocado oil margarine instead of butter, but uses avocado oil for everything else. Reviewed heart healthy eating from education last week.      Intervention Plan   Intervention Prescribe, educate and counsel regarding individualized specific dietary modifications aiming towards targeted core components such as weight, hypertension, lipid management, diabetes, heart failure and other comorbidities.    Expected Outcomes Short Term Goal: Understand basic principles of dietary content, such as calories, fat, sodium, cholesterol and nutrients.;Short Term Goal: A plan has been developed with personal nutrition goals set during dietitian appointment.;Long Term Goal: Adherence to prescribed nutrition plan.             Nutrition  Assessments:  MEDIFICTS Score Key: ?70 Need to make dietary changes  40-70 Heart Healthy Diet ? 40 Therapeutic Level Cholesterol Diet  Flowsheet Row Cardiac Rehab from 07/17/2021 in [facility] Cardiac and Pulmonary Rehab  Picture Your Plate Total Score on Admission 72      Picture Your Plate Scores: <60 Unhealthy dietary pattern with much room for improvement. 41-50 Dietary pattern unlikely to meet recommendations for good health and room for improvement. 51-60 More healthful dietary pattern, with some room for improvement.  >60 Healthy dietary pattern, although there may be some specific behaviors that could be improved.    Nutrition Goals Re-Evaluation:  Nutrition Goals Re-Evaluation     [place] Name 07/24/21 0940             Goals   Nutrition Goal Meet with dietitian       Comment Needs to set up appt. He has changed his eating appointments.       Expected Outcome Set up appointment with  dietitian                Nutrition Goals Discharge (Final Nutrition Goals Re-Evaluation):  Nutrition Goals Re-Evaluation - 07/24/21 0940       Goals   Nutrition Goal Meet with dietitian    Comment Needs to set up appt. He has changed his eating appointments.    Expected Outcome Set up appointment with dietitian             Psychosocial: Target Goals: Acknowledge presence or absence of significant depression and/or stress, maximize coping skills, provide positive support system. Participant is able to verbalize types and ability to use techniques and skills needed for reducing stress and depression.   Education: Stress, Anxiety, and Depression - Group verbal and visual presentation to define topics covered.  Reviews how body is impacted by stress, anxiety, and depression.  Also discusses healthy ways to reduce stress and to treat/manage anxiety and depression.  Written material given at graduation.   Education: Sleep Hygiene -Provides group verbal and written instruction about how sleep can affect your health.  Define sleep hygiene, discuss sleep cycles and impact of sleep habits. Review good sleep hygiene tips.    Initial Review & Psychosocial Screening:  Initial Psych Review & Screening - 06/26/21 0913       Initial Review   Current issues with None Identified      Family Dynamics   Good Support System? Yes   wife, curch     Barriers   Psychosocial barriers to participate in program There are no identifiable barriers or psychosocial needs.      Screening Interventions   Interventions Encouraged to exercise;To provide support and resources with identified psychosocial needs;Provide feedback about the scores to participant    Expected Outcomes Short Term goal: Utilizing psychosocial counselor, staff and physician to assist with identification of specific Stressors or current issues interfering with healing process. Setting desired goal for each stressor or current  issue identified.;Long Term Goal: Stressors or current issues are controlled or eliminated.;Short Term goal: Identification and review with participant of any Quality of Life or Depression concerns found by scoring the questionnaire.;Long Term goal: The participant improves quality of Life and PHQ9 Scores as seen by post scores and/or verbalization of changes             Quality of Life Scores:   Quality of Life - 07/07/21 1141       Quality of Life   Select Quality of Life  Quality of Life Scores   Health/Function Pre 21.6 %    Socioeconomic Pre 30 %    Psych/Spiritual Pre 29.14 %    Family Pre 30 %    GLOBAL Pre 26.23 %            Scores of 19 and below usually indicate a poorer quality of life in these areas.  A difference of  2-3 points is a clinically meaningful difference.  A difference of 2-3 points in the total score of the Quality of Life Index has been associated with significant improvement in overall quality of life, self-image, physical symptoms, and general health in studies assessing change in quality of life.  PHQ-9: Review Flowsheet  More data may exist      07/07/2021 02/06/2020 10/28/2018 09/29/2017 03/29/2017  Depression screen PHQ 2/9  Decreased Interest 0 0 0 0 0  Down, Depressed, Hopeless 0 0 0 0 0  PHQ - 2 Score 0 0 0 0 0  Altered sleeping 1 2 - - -  Tired, decreased energy 1 1 - - -  Change in appetite 0 0 - - -  Feeling bad or failure about yourself  0 0 - - -  Trouble concentrating 0 0 - - -  Moving slowly or fidgety/restless 0 0 - - -  Suicidal thoughts 0 0 - - -  PHQ-9 Score 2 3 - - -  Difficult doing work/chores Not difficult at all - - - -   Interpretation of Total Score  Total Score Depression Severity:  1-4 = Minimal depression, 5-9 = Mild depression, 10-14 = Moderate depression, 15-19 = Moderately severe depression, 20-27 = Severe depression   Psychosocial Evaluation and Intervention:  Psychosocial Evaluation - 06/26/21 0923        Psychosocial Evaluation & Interventions   Interventions Encouraged to exercise with the program and follow exercise prescription    Comments Gregory Obrien has no barriers to attending the program. He is ready to learn about amaginf his life to decrease symptoms. He lives with his wife and their dog. He has 7 children combined with his second wife. He has 3 out of the home and 4 children living with his ex-wife in [place].  He is ready to get started.    Expected Outcomes STG Adalbert will attend all scheduled sessions and the education sessions. He will learn more about caring for himself and living with heart disease. LTG Gregory Obrien the tools and resources form the program after discharge to continue to care for himself in a heart healthy manner.    Continue Psychosocial Services  Follow up required by staff             Psychosocial Re-Evaluation:  Psychosocial Re-Evaluation     [place] Name 07/24/21 [phone]             Psychosocial Re-Evaluation   Current issues with Current Stress Concerns       Comments Gregory Obrien is doing well in rehab.  He is planning to go to [employer] at some point to move his mother into assisted living.  He will exercise while up there.  He is not sleeping well.  He will wake up in the middle of the night and has trouble to get back to sleep.  He does use melatonin to go to sleep, but it doesn't keep him asleep.  We talked about possibly bringing up it up with his doctor.  He does nap in the afternoon to help.  Expected Outcomes Short: Talk to doctor about sleep and get mother moved.  Long: Conitnue to cope with sleep and mother.       Interventions Encouraged to attend Cardiac Rehabilitation for the exercise;Stress management education       Continue Psychosocial Services  Follow up required by staff                Psychosocial Discharge (Final Psychosocial Re-Evaluation):  Psychosocial Re-Evaluation - 07/24/21 0941       Psychosocial Re-Evaluation   Current  issues with Current Stress Concerns    Comments Gregory Obrien is doing well in rehab.  He is planning to go to [employer] at some point to move his mother into assisted living.  He will exercise while up there.  He is not sleeping well.  He will wake up in the middle of the night and has trouble to get back to sleep.  He does use melatonin to go to sleep, but it doesn't keep him asleep.  We talked about possibly bringing up it up with his doctor.  He does nap in the afternoon to help.    Expected Outcomes Short: Talk to doctor about sleep and get mother moved.  Long: Conitnue to cope with sleep and mother.    Interventions Encouraged to attend Cardiac Rehabilitation for the exercise;Stress management education    Continue Psychosocial Services  Follow up required by staff             Vocational Rehabilitation: Provide vocational rehab assistance to qualifying candidates.   Vocational Rehab Evaluation & Intervention:   Education: Education Goals: Education classes will be provided on a variety of topics geared toward better understanding of heart health and risk factor modification. Participant will state understanding/return demonstration of topics presented as noted by education test scores.  Learning Barriers/Preferences:   General Cardiac Education Topics:  AED/CPR: - Group verbal and written instruction with the use of models to demonstrate the basic use of the AED with the basic ABC's of resuscitation.   Anatomy and Cardiac Procedures: - Group verbal and visual presentation and models provide information about basic cardiac anatomy and function. Reviews the testing methods done to diagnose heart disease and the outcomes of the test results. Describes the treatment choices: Medical Management, Angioplasty, or Coronary Bypass Surgery for treating various heart conditions including Myocardial Infarction, Angina, Valve Disease, and Cardiac Arrhythmias.  Written material given at  graduation. Flowsheet Row Cardiac Rehab from 08/07/2021 in [facility] Cardiac and Pulmonary Rehab  Education need identified 07/07/21  Date 07/17/21  Educator SB  Instruction Review Code 1- Verbalizes Understanding       Medication Safety: - Group verbal and visual instruction to review commonly prescribed medications for heart and lung disease. Reviews the medication, class of the drug, and side effects. Includes the steps to properly store meds and maintain the prescription regimen.  Written material given at graduation.   Intimacy: - Group verbal instruction through game format to discuss how heart and lung disease can affect sexual intimacy. Written material given at graduation.. Flowsheet Row Cardiac Rehab from 08/07/2021 in [facility] Cardiac and Pulmonary Rehab  Date 07/10/21  Educator [facility]  Instruction Review Code 1- Verbalizes Understanding       Know Your Numbers and Heart Failure: - Group verbal and visual instruction to discuss disease risk factors for cardiac and pulmonary disease and treatment options.  Reviews associated critical values for Overweight/Obesity, Hypertension, Cholesterol, and Diabetes.  Discusses basics of heart failure: signs/symptoms and  treatments.  Introduces Heart Failure Zone chart for action plan for heart failure.  Written material given at graduation. Flowsheet Row Cardiac Rehab from 08/07/2021 in [facility] Cardiac and Pulmonary Rehab  Education need identified 07/07/21       Infection Prevention: - Provides verbal and written material to individual with discussion of infection control including proper hand washing and proper equipment cleaning during exercise session. Flowsheet Row Cardiac Rehab from 08/07/2021 in [facility] Cardiac and Pulmonary Rehab  Date 07/07/21  Educator [facility]  Instruction Review Code 1- Verbalizes Understanding       Falls Prevention: - Provides verbal and written material to individual with discussion of falls prevention and safety. Flowsheet  Row Cardiac Rehab from 08/07/2021 in [facility] Cardiac and Pulmonary Rehab  Date 06/26/21  Educator SB  Instruction Review Code 1- Verbalizes Understanding       Other: -Provides group and verbal instruction on various topics (see comments)   Knowledge Questionnaire Score:  Knowledge Questionnaire Score - 07/07/21 1143       Knowledge Questionnaire Score   Pre Score 23/26             Core Components/Risk Factors/Patient Goals at Admission:  Personal Goals and Risk Factors at Admission - 07/07/21 1143       Core Components/Risk Factors/Patient Goals on Admission    Weight Management Yes;Weight Loss    Intervention Weight Management: Develop a combined nutrition and exercise program designed to reach desired caloric intake, while maintaining appropriate intake of nutrient and fiber, sodium and fats, and appropriate energy expenditure required for the weight goal.;Weight Management/Obesity: Establish reasonable short term and long term weight goals.;Weight Management: Provide education and appropriate resources to help participant work on and attain dietary goals.    Admit Weight 172 lb 11.2 oz (78.3 kg)    Goal Weight: Short Term 167 lb (75.8 kg)    Goal Weight: Long Term 160 lb (72.6 kg)    Expected Outcomes Short Term: Continue to assess and modify interventions until short term weight is achieved;Long Term: Adherence to nutrition and physical activity/exercise program aimed toward attainment of established weight goal;Weight Loss: Understanding of general recommendations for a balanced deficit meal plan, which promotes 1-2 lb weight loss per week and includes a negative energy balance of [phone] kcal/d;Understanding recommendations for meals to include 15-35% energy as protein, 25-35% energy from fat, 35-60% energy from carbohydrates, less than 242m of dietary cholesterol, 20-35 gm of total fiber daily;Understanding of distribution of calorie intake throughout the day with the  consumption of 4-5 meals/snacks    Lipids Yes    Intervention Provide education and support for participant on nutrition & aerobic/resistive exercise along with prescribed medications to achieve LDL <72m HDL >4073m   Expected Outcomes Short Term: Participant states understanding of desired cholesterol values and is compliant with medications prescribed. Participant is following exercise prescription and nutrition guidelines.;Long Term: Cholesterol controlled with medications as prescribed, with individualized exercise RX and with personalized nutrition plan. Value goals: LDL < 73m51mDL > 40 mg.             Education:Diabetes - Individual verbal and written instruction to review signs/symptoms of diabetes, desired ranges of glucose level fasting, after meals and with exercise. Acknowledge that pre and post exercise glucose checks will be done for 3 sessions at entry of program.   Core Components/Risk Factors/Patient Goals Review:   Goals and Risk Factor Review     Row Name 07/24/21 [phone]  Core Components/Risk Factors/Patient Goals Review   Personal Goals Review Weight Management/Obesity;Lipids;Hypertension       Review Gregory Obrien is doing well in rehab.  His weight is holding steady.  His pressures are doing well.  He is doing well with his medications.  He has switched to taking his meds at night to help avoid headaches during the day.       Expected Outcomes Short: Continue to keep eye on weight Long: Conitnue to monitor risk factors.                Core Components/Risk Factors/Patient Goals at Discharge (Final Review):   Goals and Risk Factor Review - 07/24/21 0938       Core Components/Risk Factors/Patient Goals Review   Personal Goals Review Weight Management/Obesity;Lipids;Hypertension    Review Gregory Obrien is doing well in rehab.  His weight is holding steady.  His pressures are doing well.  He is doing well with his medications.  He has switched to taking his meds at  night to help avoid headaches during the day.    Expected Outcomes Short: Continue to keep eye on weight Long: Conitnue to monitor risk factors.             ITP Comments:  ITP Comments     Row Name 06/26/21 0931 07/07/21 1137 07/10/21 0934 07/16/21 1025 08/04/21 1018   ITP Comments irtual orientation call completed today. he has an appointment on Date: 07/07/2021  for EP eval and gym Orientation.  Documentation of diagnosis can be found in [facility]  Date: 06/18/2021 . Completed 6MWT and gym orientation. Initial ITP created and sent for review to Dr. Zetta Bills, Medical Director. First full day of exercise!  Patient was oriented to gym and equipment including functions, settings, policies, and procedures.  Patient's individual exercise prescription and treatment plan were reviewed.  All starting workloads were established based on the results of the 6 minute walk test done at initial orientation visit.  The plan for exercise progression was also introduced and progression will be customized based on patient's performance and goals. 30 Day review completed. Medical Director ITP review done, changes made as directed, and signed approval by Medical Director.   NEW Completed initial RD Consultation    [place] Name 08/11/21 1353 08/13/21 0805 09/05/21 1140 09/10/21 1339     ITP Comments Patient called to let us know he will be out 4-5 weeks in order to move his Mother in law into a nursing home, which requires a lot of travel. Will place on hold- patient will update [place] what day he will return back. 30 Day review completed. Medical Director ITP review done, changes made as directed, and signed approval by Medical Director.  on hold family concern Patient not attended since 08/07/2021.he is helping with his mother. Goals will be reviewed when patient returns and has been attending regularly. 30 Day review completed. Medical Director ITP review done, changes made as directed, and signed approval by Medical  Director.             Comments:

## 2021-09-11 ENCOUNTER — Telehealth: Payer: Self-pay

## 2021-09-11 DIAGNOSIS — I208 Other forms of angina pectoris: Secondary | ICD-10-CM

## 2021-09-11 NOTE — Telephone Encounter (Signed)
Spoke with patient, he has been on medical hold due to travel for his mother in law. He states he will not be able to start back until the 1st week of October. I did let him know since he will be on hold for so long that he may lose his time/class due to new incoming patients. Patient understood. He will keep [place] updated once we get closer to October.

## 2021-09-17 ENCOUNTER — Other Ambulatory Visit: Payer: Self-pay | Admitting: Cardiovascular Disease

## 2021-09-25 ENCOUNTER — Encounter: Payer: Self-pay | Admitting: *Deleted

## 2021-09-25 DIAGNOSIS — I208 Other forms of angina pectoris: Secondary | ICD-10-CM

## 2021-09-25 DIAGNOSIS — Z006 Encounter for examination for normal comparison and control in clinical research program: Secondary | ICD-10-CM

## 2021-09-25 NOTE — Research (Signed)
Spoke with Gregory Obrien to remind him of his appointment Monday at 0800

## 2021-09-29 ENCOUNTER — Encounter: Payer: Medicare PPO | Admitting: *Deleted

## 2021-09-29 ENCOUNTER — Other Ambulatory Visit: Payer: Self-pay

## 2021-09-29 VITALS — BP 139/74 | HR 55 | Temp 98.2°F | Resp 16 | Ht 67.0 in | Wt 175.0 lb

## 2021-09-29 DIAGNOSIS — Z006 Encounter for examination for normal comparison and control in clinical research program: Secondary | ICD-10-CM

## 2021-09-29 NOTE — Progress Notes (Signed)
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  Doing well and has been in an exercise program at [facility] until recently but now exercising in his apartment complex gym.    He feels well.  We went over the protocol in great detail, discussed the investigational agent and what is known, and reviewed risks and benefits of inclusion in [name].  He has an elevated triglyceride levels in the last two draws in particular.    Alert, oriented male in no distress BP        P        R     T Lungs clear Cor regular without murmur Abdomen soft without masses or tenderness Good pulses No extremity edema.    ECG  RBBB  Impression:  CAD with underlying elevation of triglycerides.  Plan:  [name] screening           Will not include in the CTA substudy.  He just recently had CTA and repeating would result in excessive radiation exposure.   Loretha Brasil. [place] Director, [facility].

## 2021-09-29 NOTE — Research (Signed)
[name] Consent     Subject Name: Gregory Obrien     Subject met inclusion and exclusion criteria.  The informed consent form, study requirements and expectations were reviewed with the subject and questions and concerns were addressed prior to the signing of the consent form.  The subject verbalized understanding of the trial requirements.  The subject agreed to participate in the [name]  trial and signed the informed consent at [place] on 25-Sept-2023.  The informed consent was obtained prior to performance of any protocol-specific procedures for the subject.  A copy of the signed informed consent was given to the subject and a copy was placed in the subject's medical record.   Renad Jenniges Ward  Protocol number 1 Consent version  2  [name]  [phone]    Site [id number]  SUBJECT ID: [id number]      DATE: 25-Sept-2023      _0  MALE                            _1  MALE AGE:70 ETHINICITY:   _2  HISPANIC/LATINO      _3  NON- HISPANIC/LATINO RACE:           _4   WHITE             _5  BLACK/AFRICAN AMERICAN                         _6   ASIAN              _7  AMERICAN INDIAN/[place] NATIVE                         _8   NATIVE [place]/OTHER PACIFIC ISLANDER                         _9   OTHER  FUTURE RESEARCH _10  USE OF SAMPLES FOR FUTURE RESEARCH _11  Consented for Sub study CTA  INCLUSION CRITERIA Consent      _12    Pregnancy authorization _13   AGE 20 or greater _14   Triglycerides fasting 150 or greater with either: _15   Dx of ASCVD (CAD, CVA, PAD) OR _16   Increased risk for ASCVD as below _17   Type 2 DM OR 2 or more below _18   Men 45 or greater Woman 1 or greater _19  _20    Woman with Hx of preeclampsia or premature menopause (before 43) _21    Family Hx of premature ASCDD (Before 7 for males, or before 53 for females  _22    Current Tobacco use  <HYIFOYDXAJOINOMV>_6<\/HMCNOBSJGGEZMOQH>_47    Metabolic syndrome <MLYYTKPTWSFKCLEX>_5<\/TZGYFVCBSWHQPRFF>_63    Hypertension with Treatment  _25    CKD stage 3 Or (GFR 30-59)  _26    LDL-C 160 or greater LDL-C 100 or  greater on therapy to lower  _27    Elevated high-sensitivity C Reactive protein (>2.0)  _28   Elevated lipoprotein (a) (>70m/dL or 124nmol/L) OR  _29   Triglycerides fasting 500 or greater  _30   Lipid-lowering med (for at least 4 weeks) [place] to comply with diet and lifestyle recommendations  _31   Females must be non-pregnant and non-lactating and EITHER  _32   Surgically sterile, post-menopausal, abstinent OR Use highly effective contraceptive at time of consent until at least 30 weeks after last dose of study drug  _33   Males must be surgical sterile, abstinent or using a highly effective contraceptive at time of consent until at least 327  weeks after the last dose of study drug   _0     EXCLUSION CRITERIA           N/A                                            _1  Major surgery, peripheral revascularization, or non-urgent PCI within 3 months prior to screening, or planned major surgery or major procedure during the study _2   Active pancreatitis within 4 weeks prior to screening _3   Acute coronary syndrome or CVA/TIA within 3 months of screening _4   Screening labs: ALT or AST >3.0 x ULN Total bilirubin >1.5 ULN unless due to Gilbert's syndrome GFR >30 Urine Protein/creatine ratio >500 Uncontrolled HTN (BP>180/100 despite Treatment Uncontrolled hypothyroidism TSH>1.5 and T4 < LLN, or Hormone therapy not stable for 4 weeks or greater  _5  _6  _7  _8  _9  _10   DM newly dx within 12 weeks of screening A1c > 9.5 at screening  _11  _12   Change in basal insulin >20% within 3 months prior to screening  _13   Type 1 Dm: episode of DKA or > 3 episodes of severe hypo glycerides with on 6 months prior to screening   Active infections, HIV, Hep C, Hep B _14   Active infection requiring systemic antiviral or antimicrobial tx that will not be complete prior to study day 1 or active Covid 19 infection not resolved by study day 1 _15   Malignancy within 5 years (except for non-melanoma skin ca, cervical in  situ ca, breast ductal ca in situ or stage 1 prostate Ca that has been tx _16   Hypersensitivity to the active substance (olezarsen or placebo) _17   Tx with another investigational drug or devise within 1 month or screening  _18   Previous tx with an oligonucleotide within 4 months of screening _19   Con meds/ procedure restrictions:   Systemic corticosteroids of anabolic steroids within 6 weeks prior to screening and during the study unless approved   _20   Use of bile acids resins (colestipol or Colesevelam) within 4 weeks prior to screening or planned during the study  _21   Plasma apheresis within 4 weeks prior to screening or planned during the study  _22   Change in meds known to exacerbate hypertriglyceridemia (beta blockers, thiazides, isotretinoin, oral antidiabetic meds, tamoxifen, estrogens or progestins within 4 weeks prior to screening  _23    Change or expected need for significant change in titration of therapies known to significantly reduce TG (GLP-1 agonists, other incretin mimetics, Phentermine/topiramate, naltrexone/bupropion, Xenical, or bariatric surgery within 3 months prior to screening  _24    Change in antipsychotic meds within 30 days of screening or >452m within 60 days of Screening  _25    Blood or plasma donation of 50-4938mwithin 30 days of screening or>499 within 60 days of screening _26   Unwilling to comply with procedures, following up, or unwillingness to cooperate fully with the investigator _27   ETOH abuse or recent (<1 year) or other substance abuse  _28     Mr RoMccollums here for [name] run in visit. He was given time to read over the consent and ask questions before signing the consent. He reports no abd pain, and no visits to the Ed or Urgent care. Vs taken at 0826, Blood work drawn at 09[employer] urine obtained at 0905. Dr StLia Foyern for Exam and to discuss the study with Mr Shanholtzer. He  reports no contrast allergy, no contraindication to  NTG, can hold breath  greater than 6 sec, and no prior CABG. After review of Mr Roodhuyzens chart it was felt since he has had a resent CTa, that it would be better to not do another CTa at this time per Dr Lia Foyer.  VS  BP= 139/74, Resp.=16, HR =55, Temp=98.2, O2 sat =97%. Wt 175 lbs and Ht. 5'7". Meds reviewed states he only takes Zyrtec PRN   Current Outpatient Medications:    aspirin EC 81 MG tablet, Take 1 tablet (81 mg total) by mouth daily. Swallow whole., Disp: 360 tablet, Rfl: 0   cetirizine (ZYRTEC) 10 MG tablet, Take 5 mg by mouth every other day., Disp: , Rfl:    ezetimibe (ZETIA) 10 MG tablet, Take 1 tablet (10 mg total) by mouth daily., Disp: 90 tablet, Rfl: 3   famotidine (PEPCID) 20 MG tablet, Take 1 tablet (20 mg total) by mouth 2 (two) times daily., Disp: 60 tablet, Rfl: 5   isosorbide mononitrate (IMDUR) 30 MG 24 hr tablet, Take 0.5 tablets (15 mg total) by mouth daily., Disp: 45 tablet, Rfl: 0   metoprolol succinate (TOPROL XL) 25 MG 24 hr tablet, Take 0.5 tablets (12.5 mg total) by mouth daily. Please keep scheduled appointment, Disp: 30 tablet, Rfl: 1   rosuvastatin (CRESTOR) 40 MG tablet, Take 1 tablet (40 mg total) by mouth daily., Disp: 90 tablet, Rfl: 3   sildenafil (REVATIO) 20 MG tablet, 2-5 tabs 1 hour prior to intercourse, Disp: 50 tablet, Rfl: 11

## 2021-10-01 NOTE — Research (Cosign Needed Addendum)
[facility] [name] Screening run in 25-Sept-2023         Hematology: Hemoglobin  13.2 g/dL                [] Clinically Significant  [x] Not Clinically Significant Eosinophil% 17.9                        [] Clinically Significant  [x] Not Clinically Significant Eosinophil (absolute) 1.4            [] Clinically Significant  [x] Not Clinically Significant    Lipids:  Triglyceride     155  mg/dL          [] Clinically Significant  [x] Not Clinically Significant   Any further action needed to be taken per the PI?  No  Pixie Casino, MD, [facility], [place] Director of the Advanced Lipid Disorders &  Cardiovascular Risk Reduction Clinic Diplomate of the American Board of Clinical Lipidology Attending Cardiologist  Direct Dial: [phone]  Fax: [phone]  Website:  [email or web address]

## 2021-10-02 DIAGNOSIS — I208 Other forms of angina pectoris: Secondary | ICD-10-CM

## 2021-10-02 DIAGNOSIS — Z006 Encounter for examination for normal comparison and control in clinical research program: Secondary | ICD-10-CM

## 2021-10-02 NOTE — Research (Addendum)
[facility] [name] Screening run in 25-Sept-2023     Lipids:  Apolipoprotein B48   0.98 mg/dL   [] Clinically Significant  [x] Not Clinically Significant   Any further action needed to be taken per the PI?  [name], MD, [facility], FACP  [place]  [facility]  Medical Director of the Advanced Lipid Disorders &  Cardiovascular Risk Reduction Clinic Diplomate of the American Board of Clinical Lipidology Attending Cardiologist  Direct Dial: [phone]  Fax: [phone]  Website:  [email or web address]

## 2021-10-02 NOTE — Research (Signed)
Called pt to confirm that triglycerides are 155 and can come in to retest to qualify for [name] study. Pt verbalized understanding and confirmed appt on 10/13/21 at 0900 to retest lipids.

## 2021-10-07 DIAGNOSIS — I2089 Other forms of angina pectoris: Secondary | ICD-10-CM

## 2021-10-07 NOTE — Progress Notes (Signed)
Cardiac Individual Treatment Plan  Patient Details  Name: Gregory Obrien MRN: [id number] Date of Birth: 11/06/1950 Referring Provider:   Flowsheet Row Cardiac Rehab from 07/07/2021 in [facility] Cardiac and Pulmonary Rehab  Referring Provider [name] MD       Initial Encounter Date:  Flowsheet Row Cardiac Rehab from 07/07/2021 in [facility] Cardiac and Pulmonary Rehab  Date 07/07/21       Visit Diagnosis: Chronic stable angina  Patient's Home Medications on Admission:  Current Outpatient Medications:    aspirin EC 81 MG tablet, Take 1 tablet (81 mg total) by mouth daily. Swallow whole., Disp: 360 tablet, Rfl: 0   cetirizine (ZYRTEC) 10 MG tablet, Take 5 mg by mouth every other day., Disp: , Rfl:    ezetimibe (ZETIA) 10 MG tablet, Take 1 tablet (10 mg total) by mouth daily., Disp: 90 tablet, Rfl: 3   famotidine (PEPCID) 20 MG tablet, Take 1 tablet (20 mg total) by mouth 2 (two) times daily., Disp: 60 tablet, Rfl: 5   isosorbide mononitrate (IMDUR) 30 MG 24 hr tablet, Take 0.5 tablets (15 mg total) by mouth daily., Disp: 45 tablet, Rfl: 0   metoprolol succinate (TOPROL XL) 25 MG 24 hr tablet, Take 0.5 tablets (12.5 mg total) by mouth daily. Please keep scheduled appointment, Disp: 30 tablet, Rfl: 1   rosuvastatin (CRESTOR) 40 MG tablet, Take 1 tablet (40 mg total) by mouth daily., Disp: 90 tablet, Rfl: 3   sildenafil (REVATIO) 20 MG tablet, 2-5 tabs 1 hour prior to intercourse, Disp: 50 tablet, Rfl: 11  Past Medical History: Past Medical History:  Diagnosis Date   Blood in stool    H/O arthroscopy of knee    History of colon polyps    UC (ulcerative colitis) ([place])    Ulcerative colitis ([place])    remission since 2017, see Dr. Willow Ora    Tobacco Use: Social History   Tobacco Use  Smoking Status Former   Packs/day: 1.00   Years: 10.00   Total pack years: 10.00   Types: Cigarettes   Quit date: 12/07/1990   Years since quitting: 30.8  Smokeless Tobacco Never    Labs: Review  Flowsheet  More data exists      Latest Ref Rng & Units 09/24/2017 10/28/2018 02/06/2020 05/08/2021 06/24/2021  Labs for ITP Cardiac and Pulmonary Rehab  Cholestrol 0 - 200 mg/dL 217  286  201  237  192   LDL (calc) 0 - 99 mg/dL 144  204  125  - -  Direct LDL mg/dL - - - 169.0  114.0   HDL-C >39.00 mg/dL 36.70  48.20  42.30  42.50  37.90   Trlycerides 0.0 - 149.0 mg/dL 183.0  171.0  168.0  391.0  271.0   Hemoglobin A1c 4.6 - 6.5 % 6.1  6.1  6.2  - -     Exercise Target Goals: Exercise Program Goal: Individual exercise prescription set using results from initial 6 min walk test and THRR while considering  patient's activity barriers and safety.   Exercise Prescription Goal: Initial exercise prescription builds to 30-45 minutes a day of aerobic activity, 2-3 days per week.  Home exercise guidelines will be given to patient during program as part of exercise prescription that the participant will acknowledge.   Education: Aerobic Exercise: - Group verbal and visual presentation on the components of exercise prescription. Introduces F.I.T.T principle from ACSM for exercise prescriptions.  Reviews F.I.T.T. principles of aerobic exercise including progression. Written material given at graduation. Flowsheet  Row Cardiac Rehab from 08/07/2021 in [facility] Cardiac and Pulmonary Rehab  Date 07/10/21  Educator [facility]  Instruction Review Code 1- Verbalizes Understanding       Education: Resistance Exercise: - Group verbal and visual presentation on the components of exercise prescription. Introduces F.I.T.T principle from ACSM for exercise prescriptions  Reviews F.I.T.T. principles of resistance exercise including progression. Written material given at graduation. Flowsheet Row Cardiac Rehab from 08/07/2021 in [facility] Cardiac and Pulmonary Rehab  Date 07/24/21  Educator [facility]        Education: Exercise & Equipment Safety: - Individual verbal instruction and demonstration of equipment use and safety with use of  the equipment. Flowsheet Row Cardiac Rehab from 08/07/2021 in [facility] Cardiac and Pulmonary Rehab  Date 07/07/21  Educator [facility]  Instruction Review Code 1- Verbalizes Understanding       Education: Exercise Physiology & General Exercise Guidelines: - Group verbal and written instruction with models to review the exercise physiology of the cardiovascular system and associated critical values. Provides general exercise guidelines with specific guidelines to those with heart or lung disease.    Education: Flexibility, Balance, Mind/Body Relaxation: - Group verbal and visual presentation with interactive activity on the components of exercise prescription. Introduces F.I.T.T principle from ACSM for exercise prescriptions. Reviews F.I.T.T. principles of flexibility and balance exercise training including progression. Also discusses the mind body connection.  Reviews various relaxation techniques to help reduce and manage stress (i.e. Deep breathing, progressive muscle relaxation, and visualization). Balance handout provided to take home. Written material given at graduation. Flowsheet Row Cardiac Rehab from 08/07/2021 in [facility] Cardiac and Pulmonary Rehab  Date 07/24/21  Educator [facility]  Instruction Review Code 1- Verbalizes Understanding       Activity Barriers & Risk Stratification:  Activity Barriers & Cardiac Risk Stratification - 07/07/21 1138       Activity Barriers & Cardiac Risk Stratification   Activity Barriers Back Problems;Deconditioning;Muscular Weakness;Shortness of Breath;Joint Problems;Chest Pain/Angina   back surgery, bilateral knee arthoscopy   Cardiac Risk Stratification Moderate             6 Minute Walk:  6 Minute Walk     Row Name 07/07/21 1137         6 Minute Walk   Phase Initial     Distance 1270 feet     Walk Time 6 minutes     # of Rest Breaks 0     MPH 2.41     METS 2.74     RPE 7     Perceived Dyspnea  1     VO2 Peak 9.57     Symptoms Yes (comment)      Comments SOB, hips sore wiht walking 1/10, no chest pain     Resting HR 60 bpm     Resting BP 116/52     Resting Oxygen Saturation  94 %     Exercise Oxygen Saturation  during 6 min walk 94 %     Max Ex. HR 83 bpm     Max Ex. BP 126/70     2 Minute Post BP 116/62              Oxygen Initial Assessment:   Oxygen Re-Evaluation:   Oxygen Discharge (Final Oxygen Re-Evaluation):   Initial Exercise Prescription:  Initial Exercise Prescription - 07/07/21 1100       Date of Initial Exercise RX and Referring Provider   Date 07/07/21    Referring Provider [name] MD  Oxygen   Maintain Oxygen Saturation 88% or higher      Treadmill   MPH 2.3    Grade 0.5    Minutes 15    METs 2.92      Elliptical   Level 1    Speed 2.5    Minutes 15    METs 2      REL-XR   Level 1    Speed 50    Minutes 15    METs 2.5      Prescription Details   Frequency (times per week) 2    Duration Progress to 30 minutes of continuous aerobic without signs/symptoms of physical distress      Intensity   THRR 40-80% of Max Heartrate 96-133    Ratings of Perceived Exertion 11-13    Perceived Dyspnea 0-4      Progression   Progression Continue to progress workloads to maintain intensity without signs/symptoms of physical distress.      Resistance Training   Training Prescription Yes    Weight 4 lb    Reps 10-15             Perform Capillary Blood Glucose checks as needed.  Exercise Prescription Changes:   Exercise Prescription Changes     Row Name 07/07/21 1100 07/16/21 1200 07/22/21 1000 07/30/21 1300 08/14/21 1500     Response to Exercise   Blood Pressure (Admit) 116/52 122/60 -- 118/58 122/80   Blood Pressure (Exercise) 126/70 136/62 -- 120/70 146/82   Blood Pressure (Exit) 116/62 112/56 -- 110/62 118/63   Heart Rate (Admit) 60 bpm 55 bpm -- 78 bpm 58 bpm   Heart Rate (Exercise) 83 bpm 106 bpm -- 93 bpm 100 bpm   Heart Rate (Exit) 58 bpm 68 bpm -- 67 bpm 60  bpm   Oxygen Saturation (Admit) 94 % -- -- -- --   Oxygen Saturation (Exercise) 94 % -- -- -- --   Rating of Perceived Exertion (Exercise) 7 15 -- 13 13   Perceived Dyspnea (Exercise) 1 -- -- -- --   Symptoms SOB, hips sore 1/10 none -- none none   Comments walk test results second full day of exercise -- -- --   Duration -- Progress to 30 minutes of  aerobic without signs/symptoms of physical distress Progress to 30 minutes of  aerobic without signs/symptoms of physical distress Continue with 30 min of aerobic exercise without signs/symptoms of physical distress. Continue with 30 min of aerobic exercise without signs/symptoms of physical distress.   Intensity -- THRR unchanged THRR unchanged THRR unchanged THRR unchanged     Progression   Progression -- Continue to progress workloads to maintain intensity without signs/symptoms of physical distress. Continue to progress workloads to maintain intensity without signs/symptoms of physical distress. Continue to progress workloads to maintain intensity without signs/symptoms of physical distress. Continue to progress workloads to maintain intensity without signs/symptoms of physical distress.   Average METs -- 2.92 2.92 4.3 4.25     Resistance Training   Training Prescription -- Yes Yes Yes Yes   Weight -- 4 lb 4 lb 4 lb 4 lb   Reps -- 10-15 10-15 10-15 10-15     Interval Training   Interval Training -- No No No No     Treadmill   MPH -- 2.3 2.3 2.9 3   Grade -- 0.5 0._0 Minutes -- _1 METs -- 2.92 2.92 5.62 5.78  NuStep   Level -- -- -- 4 4   Minutes -- -- -- 15 15   METs -- -- -- 3.4 3.9     Elliptical   Level -- 1 1 -- 1   Speed -- 2.5 2.5 -- 2.5   Minutes -- 15 15 -- 15     REL-XR   Level -- -- -- 3 --   Minutes -- -- -- 15 --   METs -- -- -- 3.1 --     Biostep-RELP   Level -- -- -- 4 --   Minutes -- -- -- 15 --   METs -- -- -- 4 --     Home Exercise Plan   Plans to continue exercise at -- --  [employer] (comment)  walking, swimming, exercise equipment [employer] (comment)  walking, swimming, exercise equipment [employer] (comment)  walking, swimming, exercise equipment   Frequency -- -- Add 3 additional days to program exercise sessions. Add 3 additional days to program exercise sessions. Add 3 additional days to program exercise sessions.   Initial Home Exercises Provided -- -- 07/22/21 07/22/21 07/22/21     Oxygen   Maintain Oxygen Saturation -- 88% or higher 88% or higher 88% or higher 88% or higher            Exercise Comments:   Exercise Comments     Row Name 07/10/21 0934           Exercise Comments First full day of exercise!  Patient was oriented to gym and equipment including functions, settings, policies, and procedures.  Patient's individual exercise prescription and treatment plan were reviewed.  All starting workloads were established based on the results of the 6 minute walk test done at initial orientation visit.  The plan for exercise progression was also introduced and progression will be customized based on patient's performance and goals.                Exercise Goals and Review:   Exercise Goals     Row Name 07/07/21 1140             Exercise Goals   Increase Physical Activity Yes       Intervention Provide advice, education, support and counseling about physical activity/exercise needs.;Develop an individualized exercise prescription for aerobic and resistive training based on initial evaluation findings, risk stratification, comorbidities and participant's personal goals.       Expected Outcomes Short Term: Attend rehab on a regular basis to increase amount of physical activity.;Long Term: Exercising regularly at least 3-5 days a week.;Long Term: Add in home exercise to make exercise part of routine and to increase amount of physical activity.       Increase Strength and Stamina Yes       Intervention Provide  advice, education, support and counseling about physical activity/exercise needs.;Develop an individualized exercise prescription for aerobic and resistive training based on initial evaluation findings, risk stratification, comorbidities and participant's personal goals.       Expected Outcomes Short Term: Perform resistance training exercises routinely during rehab and add in resistance training at home;Short Term: Increase workloads from initial exercise prescription for resistance, speed, and METs.;Long Term: Improve cardiorespiratory fitness, muscular endurance and strength as measured by increased METs and functional capacity (6MWT)       Able to understand and use rate of perceived exertion (RPE) scale Yes       Intervention Provide education and explanation on how to use RPE scale  Expected Outcomes Short Term: Able to use RPE daily in rehab to express subjective intensity level;Long Term:  Able to use RPE to guide intensity level when exercising independently       Able to understand and use Dyspnea scale Yes       Intervention Provide education and explanation on how to use Dyspnea scale       Expected Outcomes Short Term: Able to use Dyspnea scale daily in rehab to express subjective sense of shortness of breath during exertion;Long Term: Able to use Dyspnea scale to guide intensity level when exercising independently       Knowledge and understanding of Target Heart Rate Range (THRR) Yes       Intervention Provide education and explanation of THRR including how the numbers were predicted and where they are located for reference       Expected Outcomes Short Term: Able to use daily as guideline for intensity in rehab;Short Term: Able to state/look up THRR;Long Term: Able to use THRR to govern intensity when exercising independently       Able to check pulse independently Yes       Intervention Provide education and demonstration on how to check pulse in carotid and radial arteries.;Review  the importance of being able to check your own pulse for safety during independent exercise       Expected Outcomes Short Term: Able to explain why pulse checking is important during independent exercise;Long Term: Able to check pulse independently and accurately       Understanding of Exercise Prescription Yes       Intervention Provide education, explanation, and written materials on patient's individual exercise prescription       Expected Outcomes Short Term: Able to explain program exercise prescription;Long Term: Able to explain home exercise prescription to exercise independently                Exercise Goals Re-Evaluation :  Exercise Goals Re-Evaluation     Row Name 07/10/21 0934 07/16/21 1247 07/22/21 1009 07/30/21 1329 08/14/21 1512     Exercise Goal Re-Evaluation   Exercise Goals Review Increase Physical Activity;Able to understand and use rate of perceived exertion (RPE) scale;Knowledge and understanding of Target Heart Rate Range (THRR);Understanding of Exercise Prescription;Able to check pulse independently;Able to understand and use Dyspnea scale;Increase Strength and Stamina Increase Physical Activity;Increase Strength and Stamina;Understanding of Exercise Prescription Increase Strength and Stamina;Increase Physical Activity;Able to understand and use rate of perceived exertion (RPE) scale;Able to understand and use Dyspnea scale;Knowledge and understanding of Target Heart Rate Range (THRR);Able to check pulse independently;Understanding of Exercise Prescription Increase Physical Activity;Increase Strength and Stamina;Understanding of Exercise Prescription Increase Physical Activity;Increase Strength and Stamina;Understanding of Exercise Prescription   Comments Reviewed RPE and dyspnea scales, THR and program prescription with pt today.  Pt voiced understanding and was given a copy of goals to take home. Auther is off to a good start in rehab.  He has completed the first two full  days of exercise and has tackled the elliptical right out the gate.  We will continue to monitor his progress. Reviewed home exercise with pt today.  Pt plans to walk, swim, and use his exercise equipment for exercise.  Reviewed THR, pulse, RPE, sign and symptoms, pulse oximetery and when to call 911 or MD.  Also discussed weather considerations and indoor options.  Pt voiced understanding. Jerri is doing well in rehab.  He is up to 2.9 mph on treadmill and incline vaires from 3%  to 6% grade.  We will continue to montior his progress. Montez is doing well in rehab. He is up to 3 mph on the treadmill while maintaining an incline of 6%. He has also done well tolerating level 4 on the T4 and level 5 on the recumbent bike. We will continue to montior his progress.   Expected Outcomes Short: Use RPE daily to regulate intensity. Long: Follow program prescription in THR. Short: Continue to attend rehab regularly Long: Continue to follow program prescription Short: add 2-3 days of exercies at home on off days of cardiac rehab class. Long: Become independent with exercise routine upon graduation from cardiac rehab. Short: Continue to climb on treadmill Long: Continue to improve stamina Short: Continue to improve workload on treadmill. Long: Continue to increase overall MET level.    [place] Name 08/26/21 1409 09/10/21 1426 09/25/21 1542         Exercise Goal Re-Evaluation   Exercise Goals Review Increase Physical Activity;Increase Strength and Stamina;Understanding of Exercise Prescription Increase Physical Activity;Increase Strength and Stamina;Understanding of Exercise Prescription --     Comments Vernal has not attended rehab since the last evaluation because he is helping move his family member out of state. We will continue to monitor his progress when he returns to the program. Almer has not attended rehab since the last evaluation because he is helping move his family member out of state. We will continue to monitor  his progress when he returns to the program. Lieutenant continues to be out with mother in law     Expected Outcomes Short: return to rehab. Long: Continue to increase strength and stamina. Short: return to rehab. Long: Continue to increase strength and stamina. --              Discharge Exercise Prescription (Final Exercise Prescription Changes):  Exercise Prescription Changes - 08/14/21 1500       Response to Exercise   Blood Pressure (Admit) 122/80    Blood Pressure (Exercise) 146/82    Blood Pressure (Exit) 118/63    Heart Rate (Admit) 58 bpm    Heart Rate (Exercise) 100 bpm    Heart Rate (Exit) 60 bpm    Rating of Perceived Exertion (Exercise) 13    Symptoms none    Duration Continue with 30 min of aerobic exercise without signs/symptoms of physical distress.    Intensity THRR unchanged      Progression   Progression Continue to progress workloads to maintain intensity without signs/symptoms of physical distress.    Average METs 4.25      Resistance Training   Training Prescription Yes    Weight 4 lb    Reps 10-15      Interval Training   Interval Training No      Treadmill   MPH 3    Grade 6    Minutes 15    METs 5.78      NuStep   Level 4    Minutes 15    METs 3.9      Elliptical   Level 1    Speed 2.5    Minutes 15      Home Exercise Plan   Plans to continue exercise at [employer] (comment)   walking, swimming, exercise equipment   Frequency Add 3 additional days to program exercise sessions.    Initial Home Exercises Provided 07/22/21      Oxygen   Maintain Oxygen Saturation 88% or higher  Nutrition:  Target Goals: Understanding of nutrition guidelines, daily intake of sodium <1545m, cholesterol <2062m calories 30% from fat and 7% or less from saturated fats, daily to have 5 or more servings of fruits and vegetables.  Education: All About Nutrition: -Group instruction provided by verbal, written material, interactive  activities, discussions, models, and posters to present general guidelines for heart healthy nutrition including fat, fiber, MyPlate, the role of sodium in heart healthy nutrition, utilization of the nutrition label, and utilization of this knowledge for meal planning. Follow up email sent as well. Written material given at graduation. Flowsheet [facility]  Education need identified 07/07/21  Date 07/31/21  Educator [place]ruction Review Code 1- Verbalizes Understanding       Biometrics:  Pre Biometrics - 07/07/21 1140       Pre Biometrics   Height 5' 7.1" (1.704 m)    Weight 172 lb 11.2 oz (78.3 kg)    BMI (Calculated) 26.98    Single Leg Stand 23.5 seconds              Nutrition Therapy Plan and Nutrition Goals:  Nutrition Therapy & Goals - 08/04/21 0946       Nutrition Therapy   Diet Heart healthy, low Na    Drug/Food Interactions Statins/Certain Fruits    Protein (specify units) 65-70g    Fiber 30 grams   as tolerated   Whole Grain Foods 1 servings   ideally 3, however, grains can cause GI distress for him   Saturated Fats 16 max. grams    Fruits and Vegetables 8 servings/day   as tolerated   Sodium 2 grams      Personal Nutrition Goals   Nutrition Goal ST: practice MyPlate guidelines, continue to vary plant foods in meals LT: continue to maintain heart healthy changes made after hospital visit, eat a variety of plant foods as tolerated    Comments [age].o. M admitted to cardiac rehab for chrinc stable angina. PMHx includes CAD, GERD, prediabetes, HLD, former smoker, IBD (Ulcerative Colitis), COVID (Jan 2023), AAA. Relevant medications includes pepcid, crestor. PYP Score: 72. Vegetables & Fruits 7/12. Breads, Grains & Cereals 9/12. Red & Processed Meat 11/12. Poultry 2/2. Fish & Shellfish 1/4. Beans, Nuts & Seeds 1/4. Milk & Dairy Foods 5/6. Toppings, Oils, Seasonings & Salt 17/20. Sweets, Snacks & Restaurant Food  9/14. Beverages 10/10. PaSedriceports that since his hospital visit and has made changes. He has limited red meat and processed meat, when he gets red meat, he gets lean meat. He has limited cheese as well. B: 6/630: whole grain cereal (almond/nut cereal) with blueberries with coffee (black) L: english muffin (whole wheat and regular) with almond butter and sometimes soup (limiting salt) D: varies - last night they had tu[place]urgers with tater tots. Drinks: water - he reports he has starting drinking more. He has one beer with dinner. He reports he has had UC for 15 years and has not had a flare up. He reports beans and lentils as well as whole grains give him GI distress. He has been using avocado oil margarine instead of butter, but uses avocado oil for everything else. Reviewed heart healthy eating from education last week.      Intervention Plan   Intervention Prescribe, educate and counsel regarding individualized specific dietary modifications aiming towards targeted core components such as weight, hypertension, lipid management, diabetes, heart failure and other comorbidities.    Expected  Outcomes Short Term Goal: Understand basic principles of dietary content, such as calories, fat, sodium, cholesterol and nutrients.;Short Term Goal: A plan has been developed with personal nutrition goals set during dietitian appointment.;Long Term Goal: Adherence to prescribed nutrition plan.             Nutrition Assessments:  MEDIFICTS Score Key: ?70 Need to make dietary changes  40-70 Heart Healthy Diet ? 40 Therapeutic Level Cholesterol Diet  Flowsheet Row Cardiac Rehab from 07/17/2021 in [facility] Cardiac and Pulmonary Rehab  Picture Your Plate Total Score on Admission 72      Picture Your Plate Scores: <65 Unhealthy dietary pattern with much room for improvement. 41-50 Dietary pattern unlikely to meet recommendations for good health and room for improvement. 51-60 More healthful dietary pattern,  with some room for improvement.  >60 Healthy dietary pattern, although there may be some specific behaviors that could be improved.    Nutrition Goals Re-Evaluation:  Nutrition Goals Re-Evaluation     [place] Name 07/24/21 0940             Goals   Nutrition Goal Meet with dietitian       Comment Needs to set up appt. He has changed his eating appointments.       Expected Outcome Set up appointment with dietitian                Nutrition Goals Discharge (Final Nutrition Goals Re-Evaluation):  Nutrition Goals Re-Evaluation - 07/24/21 0940       Goals   Nutrition Goal Meet with dietitian    Comment Needs to set up appt. He has changed his eating appointments.    Expected Outcome Set up appointment with dietitian             Psychosocial: Target Goals: Acknowledge presence or absence of significant depression and/or stress, maximize coping skills, provide positive support system. Participant is able to verbalize types and ability to use techniques and skills needed for reducing stress and depression.   Education: Stress, Anxiety, and Depression - Group verbal and visual presentation to define topics covered.  Reviews how body is impacted by stress, anxiety, and depression.  Also discusses healthy ways to reduce stress and to treat/manage anxiety and depression.  Written material given at graduation.   Education: Sleep Hygiene -Provides group verbal and written instruction about how sleep can affect your health.  Define sleep hygiene, discuss sleep cycles and impact of sleep habits. Review good sleep hygiene tips.    Initial Review & Psychosocial Screening:  Initial Psych Review & Screening - 06/26/21 0913       Initial Review   Current issues with None Identified      Family Dynamics   Good Support System? Yes   wife, curch     Barriers   Psychosocial barriers to participate in program There are no identifiable barriers or psychosocial needs.      Screening  Interventions   Interventions Encouraged to exercise;To provide support and resources with identified psychosocial needs;Provide feedback about the scores to participant    Expected Outcomes Short Term goal: Utilizing psychosocial counselor, staff and physician to assist with identification of specific Stressors or current issues interfering with healing process. Setting desired goal for each stressor or current issue identified.;Long Term Goal: Stressors or current issues are controlled or eliminated.;Short Term goal: Identification and review with participant of any Quality of Life or Depression concerns found by scoring the questionnaire.;Long Term goal: The participant improves quality of Life  and PHQ9 Scores as seen by post scores and/or verbalization of changes             Quality of Life Scores:   Quality of Life - 07/07/21 1141       Quality of Life   Select Quality of Life      Quality of Life Scores   Health/Function Pre 21.6 %    Socioeconomic Pre 30 %    Psych/Spiritual Pre 29.14 %    Family Pre 30 %    GLOBAL Pre 26.23 %            Scores of 19 and below usually indicate a poorer quality of life in these areas.  A difference of  2-3 points is a clinically meaningful difference.  A difference of 2-3 points in the total score of the Quality of Life Index has been associated with significant improvement in overall quality of life, self-image, physical symptoms, and general health in studies assessing change in quality of life.  PHQ-9: Review Flowsheet  More data may exist      07/07/2021 02/06/2020 10/28/2018 09/29/2017 03/29/2017  Depression screen PHQ 2/9  Decreased Interest 0 0 0 0 0  Down, Depressed, Hopeless 0 0 0 0 0  PHQ - 2 Score 0 0 0 0 0  Altered sleeping 1 2 - - -  Tired, decreased energy 1 1 - - -  Change in appetite 0 0 - - -  Feeling bad or failure about yourself  0 0 - - -  Trouble concentrating 0 0 - - -  Moving slowly or fidgety/restless 0 0 - - -   Suicidal thoughts 0 0 - - -  PHQ-9 Score 2 3 - - -  Difficult doing work/chores Not difficult at all - - - -   Interpretation of Total Score  Total Score Depression Severity:  1-4 = Minimal depression, 5-9 = Mild depression, 10-14 = Moderate depression, 15-19 = Moderately severe depression, 20-27 = Severe depression   Psychosocial Evaluation and Intervention:  Psychosocial Evaluation - 06/26/21 0923       Psychosocial Evaluation & Interventions   Interventions Encouraged to exercise with the program and follow exercise prescription    Comments Kiel has no barriers to attending the program. He is ready to learn about amaginf his life to decrease symptoms. He lives with his wife and their dog. He has 7 children combined with his second wife. He has 3 out of the home and 4 children living with his ex-wife in [place].  He is ready to get started.    Expected Outcomes STG Quint will attend all scheduled sessions and the education sessions. He will learn more about caring for himself and living with heart disease. LTG linzie boursiquot the tools and resources form the program after discharge to continue to care for himself in a heart healthy manner.    Continue Psychosocial Services  Follow up required by staff             Psychosocial Re-Evaluation:  Psychosocial Re-Evaluation     [place] Name 07/24/21 [phone]             Psychosocial Re-Evaluation   Current issues with Current Stress Concerns       Comments Ege is doing well in rehab.  He is planning to go to [employer] at some point to move his mother into assisted living.  He will exercise while up there.  He is not sleeping well.  He will wake up in the middle of the night and has trouble to get back to sleep.  He does use melatonin to go to sleep, but it doesn't keep him asleep.  We talked about possibly bringing up it up with his doctor.  He does nap in the afternoon to help.       Expected Outcomes Short: Talk to doctor about sleep  and get mother moved.  Long: Conitnue to cope with sleep and mother.       Interventions Encouraged to attend Cardiac Rehabilitation for the exercise;Stress management education       Continue Psychosocial Services  Follow up required by staff                Psychosocial Discharge (Final Psychosocial Re-Evaluation):  Psychosocial Re-Evaluation - 07/24/21 0941       Psychosocial Re-Evaluation   Current issues with Current Stress Concerns    Comments Lorain is doing well in rehab.  He is planning to go to [employer] at some point to move his mother into assisted living.  He will exercise while up there.  He is not sleeping well.  He will wake up in the middle of the night and has trouble to get back to sleep.  He does use melatonin to go to sleep, but it doesn't keep him asleep.  We talked about possibly bringing up it up with his doctor.  He does nap in the afternoon to help.    Expected Outcomes Short: Talk to doctor about sleep and get mother moved.  Long: Conitnue to cope with sleep and mother.    Interventions Encouraged to attend Cardiac Rehabilitation for the exercise;Stress management education    Continue Psychosocial Services  Follow up required by staff             Vocational Rehabilitation: Provide vocational rehab assistance to qualifying candidates.   Vocational Rehab Evaluation & Intervention:   Education: Education Goals: Education classes will be provided on a variety of topics geared toward better understanding of heart health and risk factor modification. Participant will state understanding/return demonstration of topics presented as noted by education test scores.  Learning Barriers/Preferences:   General Cardiac Education Topics:  AED/CPR: - Group verbal and written instruction with the use of models to demonstrate the basic use of the AED with the basic ABC's of resuscitation.   Anatomy and Cardiac Procedures: - Group verbal and visual presentation and  models provide information about basic cardiac anatomy and function. Reviews the testing methods done to diagnose heart disease and the outcomes of the test results. Describes the treatment choices: Medical Management, Angioplasty, or Coronary Bypass Surgery for treating various heart conditions including Myocardial Infarction, Angina, Valve Disease, and Cardiac Arrhythmias.  Written material given at graduation. Flowsheet Row Cardiac Rehab from 08/07/2021 in [facility] Cardiac and Pulmonary Rehab  Education need identified 07/07/21  Date 07/17/21  Educator SB  Instruction Review Code 1- Verbalizes Understanding       Medication Safety: - Group verbal and visual instruction to review commonly prescribed medications for heart and lung disease. Reviews the medication, class of the drug, and side effects. Includes the steps to properly store meds and maintain the prescription regimen.  Written material given at graduation.   Intimacy: - Group verbal instruction through game format to discuss how heart and lung disease can affect sexual intimacy. Written material given at graduation.. Flowsheet Row Cardiac Rehab from 08/07/2021 in [facility] Cardiac and Pulmonary Rehab  Date 07/10/21  Educator [facility]  Instruction Review Code 1- Verbalizes Understanding       Know Your Numbers and Heart Failure: - Group verbal and visual instruction to discuss disease risk factors for cardiac and pulmonary disease and treatment options.  Reviews associated critical values for Overweight/Obesity, Hypertension, Cholesterol, and Diabetes.  Discusses basics of heart failure: signs/symptoms and treatments.  Introduces Heart Failure Zone chart for action plan for heart failure.  Written material given at graduation. Flowsheet Row Cardiac Rehab from 08/07/2021 in [facility] Cardiac and Pulmonary Rehab  Education need identified 07/07/21       Infection Prevention: - Provides verbal and written material to individual with discussion of  infection control including proper hand washing and proper equipment cleaning during exercise session. Flowsheet Row Cardiac Rehab from 08/07/2021 in [facility] Cardiac and Pulmonary Rehab  Date 07/07/21  Educator [facility]  Instruction Review Code 1- Verbalizes Understanding       Falls Prevention: - Provides verbal and written material to individual with discussion of falls prevention and safety. Flowsheet Row Cardiac Rehab from 08/07/2021 in [facility] Cardiac and Pulmonary Rehab  Date 06/26/21  Educator SB  Instruction Review Code 1- Verbalizes Understanding       Other: -Provides group and verbal instruction on various topics (see comments)   Knowledge Questionnaire Score:  Knowledge Questionnaire Score - 07/07/21 1143       Knowledge Questionnaire Score   Pre Score 23/26             Core Components/Risk Factors/Patient Goals at Admission:  Personal Goals and Risk Factors at Admission - 07/07/21 1143       Core Components/Risk Factors/Patient Goals on Admission    Weight Management Yes;Weight Loss    Intervention Weight Management: Develop a combined nutrition and exercise program designed to reach desired caloric intake, while maintaining appropriate intake of nutrient and fiber, sodium and fats, and appropriate energy expenditure required for the weight goal.;Weight Management/Obesity: Establish reasonable short term and long term weight goals.;Weight Management: Provide education and appropriate resources to help participant work on and attain dietary goals.    Admit Weight 172 lb 11.2 oz (78.3 kg)    Goal Weight: Short Term 167 lb (75.8 kg)    Goal Weight: Long Term 160 lb (72.6 kg)    Expected Outcomes Short Term: Continue to assess and modify interventions until short term weight is achieved;Long Term: Adherence to nutrition and physical activity/exercise program aimed toward attainment of established weight goal;Weight Loss: Understanding of general recommendations for a balanced  deficit meal plan, which promotes 1-2 lb weight loss per week and includes a negative energy balance of [phone] kcal/d;Understanding recommendations for meals to include 15-35% energy as protein, 25-35% energy from fat, 35-60% energy from carbohydrates, less than 265m of dietary cholesterol, 20-35 gm of total fiber daily;Understanding of distribution of calorie intake throughout the day with the consumption of 4-5 meals/snacks    Lipids Yes    Intervention Provide education and support for participant on nutrition & aerobic/resistive exercise along with prescribed medications to achieve LDL <757m HDL >4036m   Expected Outcomes Short Term: Participant states understanding of desired cholesterol values and is compliant with medications prescribed. Participant is following exercise prescription and nutrition guidelines.;Long Term: Cholesterol controlled with medications as prescribed, with individualized exercise RX and with personalized nutrition plan. Value goals: LDL < 20m34mDL > 40 mg.             Education:Diabetes - Individual verbal and written instruction to review signs/symptoms  of diabetes, desired ranges of glucose level fasting, after meals and with exercise. Acknowledge that pre and post exercise glucose checks will be done for 3 sessions at entry of program.   Core Components/Risk Factors/Patient Goals Review:   Goals and Risk Factor Review     Row Name 07/24/21 0938             Core Components/Risk Factors/Patient Goals Review   Personal Goals Review Weight Management/Obesity;Lipids;Hypertension       Review Malin is doing well in rehab.  His weight is holding steady.  His pressures are doing well.  He is doing well with his medications.  He has switched to taking his meds at night to help avoid headaches during the day.       Expected Outcomes Short: Continue to keep eye on weight Long: Conitnue to monitor risk factors.                Core Components/Risk  Factors/Patient Goals at Discharge (Final Review):   Goals and Risk Factor Review - 07/24/21 0938       Core Components/Risk Factors/Patient Goals Review   Personal Goals Review Weight Management/Obesity;Lipids;Hypertension    Review Drevon is doing well in rehab.  His weight is holding steady.  His pressures are doing well.  He is doing well with his medications.  He has switched to taking his meds at night to help avoid headaches during the day.    Expected Outcomes Short: Continue to keep eye on weight Long: Conitnue to monitor risk factors.             ITP Comments:  ITP Comments     Row Name 06/26/21 0931 07/07/21 1137 07/10/21 0934 07/16/21 1025 08/04/21 1018   ITP Comments irtual orientation call completed today. he has an appointment on Date: 07/07/2021  for EP eval and gym Orientation.  Documentation of diagnosis [facility] in [facility]  Date: 06/18/2021 . Completed 6MWT and gym orientation. Initial ITP created and sent for review to Dr. Zetta Bills, Medical Director. First full day of exercise!  Patient was oriented to gym and equipment including functions, settings, policies, and procedures.  Patient's individual exercise prescription and treatment plan were reviewed.  All starting workloads were established based on the results of the 6 minute walk test done at initial orientation visit.  The plan for exercise progression was also introduced and progression will be customized based on patient's performance and goals. 30 Day review completed. Medical Director ITP review done, changes made as directed, and signed approval by Medical Director.   NEW Completed initial RD Consultation    [place] Name 08/11/21 1353 08/13/21 0805 09/05/21 1140 09/10/21 1339 09/11/21 1518   ITP Comments Patient called to let us know he will be out 4-5 weeks in order to move his Mother in law into a nursing home, which requires a lot of travel. Will place on hold- patient will update [place] what day he will return  back. 30 Day review completed. Medical Director ITP review done, changes made as directed, and signed approval by Medical Director.  on hold family concern Patient not attended since 08/07/2021.he is helping with his mother. Goals will be reviewed when patient returns and has been attending regularly. 30 Day review completed. Medical Director ITP review done, changes made as directed, and signed approval by Medical Director. Spoke with patient, he has been on medical hold due to travel for his mother in law. He states he will not be  able to start back until the 1st week of October. I did let him know since he will be on hold for so long that he may lose his time/class due to new incoming patients. Patient understood. He will keep [place] updated once we get closer to October.    [place] Name 09/25/21 1542 10/02/21 1047 10/07/21 1454       ITP Comments Daeshon continues to be out with mother in law Patient to return the 1st week of October, he has been at rehab due to taking care of family issues. Called patient to follow up as he did not return to rehab this week. He has been out due to [place] of state. Patient states he would like to discharge at this time              Comments: Discharge ITP

## 2021-10-07 NOTE — Progress Notes (Signed)
Discharge Progress Report  Patient Details  Name: Gregory Obrien MRN: [id number] Date of Birth: 11-Nov-1950 Referring Provider:   Flowsheet Row Cardiac Rehab from 07/07/2021 in [facility] Cardiac and Pulmonary Rehab  Referring Provider [name] MD        Number of Visits: 14  Reason for Discharge:  Early Exit:  Personal- Family  Smoking History:  Social History   Tobacco Use  Smoking Status Former   Packs/day: 1.00   Years: 10.00   Total pack years: 10.00   Types: Cigarettes   Quit date: 12/07/1990   Years since quitting: 30.8  Smokeless Tobacco Never    Diagnosis:  Chronic stable angina  ADL UCSD:   Initial Exercise Prescription:  Initial Exercise Prescription - 07/07/21 1100       Date of Initial Exercise RX and Referring Provider   Date 07/07/21    Referring Provider [name] MD      Oxygen   Maintain Oxygen Saturation 88% or higher      Treadmill   MPH 2.3    Grade 0.5    Minutes 15    METs 2.92      Elliptical   Level 1    Speed 2.5    Minutes 15    METs 2      REL-XR   Level 1    Speed 50    Minutes 15    METs 2.5      Prescription Details   Frequency (times per week) 2    Duration Progress to 30 minutes of continuous aerobic without signs/symptoms of physical distress      Intensity   THRR 40-80% of Max Heartrate 96-133    Ratings of Perceived Exertion 11-13    Perceived Dyspnea 0-4      Progression   Progression Continue to progress workloads to maintain intensity without signs/symptoms of physical distress.      Resistance Training   Training Prescription Yes    Weight 4 lb    Reps 10-15             Discharge Exercise Prescription (Final Exercise Prescription Changes):  Exercise Prescription Changes - 08/14/21 1500       Response to Exercise   Blood Pressure (Admit) 122/80    Blood Pressure (Exercise) 146/82    Blood Pressure (Exit) 118/63    Heart Rate (Admit) 58 bpm    Heart Rate (Exercise) 100 bpm     Heart Rate (Exit) 60 bpm    Rating of Perceived Exertion (Exercise) 13    Symptoms none    Duration Continue with 30 min of aerobic exercise without signs/symptoms of physical distress.    Intensity THRR unchanged      Progression   Progression Continue to progress workloads to maintain intensity without signs/symptoms of physical distress.    Average METs 4.25      Resistance Training   Training Prescription Yes    Weight 4 lb    Reps 10-15      Interval Training   Interval Training No      Treadmill   MPH 3    Grade 6    Minutes 15    METs 5.78      NuStep   Level 4    Minutes 15    METs 3.9      Elliptical   Level 1    Speed 2.5    Minutes 15      Home Exercise Plan   Plans  to continue exercise at [employer] (comment)   walking, swimming, exercise equipment   Frequency Add 3 additional days to program exercise sessions.    Initial Home Exercises Provided 07/22/21      Oxygen   Maintain Oxygen Saturation 88% or higher             Functional Capacity:  6 Minute Walk     Row Name 07/07/21 1137         6 Minute Walk   Phase Initial     Distance 1270 feet     Walk Time 6 minutes     # of Rest Breaks 0     MPH 2.41     METS 2.74     RPE 7     Perceived Dyspnea  1     VO2 Peak 9.57     Symptoms Yes (comment)     Comments SOB, hips sore wiht walking 1/10, no chest pain     Resting HR 60 bpm     Resting BP 116/52     Resting Oxygen Saturation  94 %     Exercise Oxygen Saturation  during 6 min walk 94 %     Max Ex. HR 83 bpm     Max Ex. BP 126/70     2 Minute Post BP 116/62              Nutrition & Weight - Outcomes:  Pre Biometrics - 07/07/21 1140       Pre Biometrics   Height 5' 7.1" (1.704 m)    Weight 172 lb 11.2 oz (78.3 kg)    BMI (Calculated) 26.98    Single Leg Stand 23.5 seconds              Nutrition:  Nutrition Therapy & Goals - 08/04/21 0946       Nutrition Therapy   Diet Heart healthy, low Na     Drug/Food Interactions Statins/Certain Fruits    Protein (specify units) 65-70g    Fiber 30 grams   as tolerated   Whole Grain Foods 1 servings   ideally 3, however, grains can cause GI distress for him   Saturated Fats 16 max. grams    Fruits and Vegetables 8 servings/day   as tolerated   Sodium 2 grams      Personal Nutrition Goals   Nutrition Goal ST: practice MyPlate guidelines, continue to vary plant foods in meals LT: continue to maintain heart healthy changes made after hospital visit, eat a variety of plant foods as tolerated    Comments 71 y.o. M admitted to cardiac rehab for chrinc stable angina. PMHx includes CAD, GERD, prediabetes, HLD, former smoker, IBD (Ulcerative Colitis), COVID (Jan 2023), AAA. Relevant medications includes pepcid, crestor. PYP Score: 72. Vegetables & Fruits 7/12. Breads, Grains & Cereals 9/12. Red & Processed Meat 11/12. Poultry 2/2. Fish & Shellfish 1/4. Beans, Nuts & Seeds 1/4. Milk & Dairy Foods 5/6. Toppings, Oils, Seasonings & Salt 17/20. Sweets, Snacks & Restaurant Food 9/14. Beverages 10/10. Shahmeer reports that since his hospital visit and has made changes. He has limited red meat and processed meat, when he gets red meat, he gets lean meat. He has limited cheese as well. B: 6/630: whole grain cereal (almond/nut cereal) with blueberries with coffee (black) L: english muffin (whole wheat and regular) with almond butter and sometimes soup (limiting salt) D: varies - last night they had [place] burgers with tater tots. Drinks: water - he reports  he has starting drinking more. He has one beer with dinner. He reports he has had UC for 15 years and has not had a flare up. He reports beans and lentils as well as whole grains give him GI distress. He has been using avocado oil margarine instead of butter, but uses avocado oil for everything else. Reviewed heart healthy eating from education last week.      Intervention Plan   Intervention Prescribe, educate and counsel  regarding individualized specific dietary modifications aiming towards targeted core components such as weight, hypertension, lipid management, diabetes, heart failure and other comorbidities.    Expected Outcomes Short Term Goal: Understand basic principles of dietary content, such as calories, fat, sodium, cholesterol and nutrients.;Short Term Goal: A plan has been developed with personal nutrition goals set during dietitian appointment.;Long Term Goal: Adherence to prescribed nutrition plan.              Goals reviewed with patient; copy given to patient.

## 2021-10-10 NOTE — Research (Signed)
[name]   Screening Visit

## 2021-10-13 ENCOUNTER — Other Ambulatory Visit: Payer: Self-pay

## 2021-10-13 ENCOUNTER — Encounter: Payer: Medicare PPO | Admitting: *Deleted

## 2021-10-13 VITALS — BP 126/64 | HR 62 | Temp 97.7°F | Resp 16

## 2021-10-13 DIAGNOSIS — Z006 Encounter for examination for normal comparison and control in clinical research program: Secondary | ICD-10-CM

## 2021-10-13 NOTE — Research (Addendum)
 [facility]  Screening re test 1 [name] 13-Oct-2021        Lipids: Apolipoprotein B48  2.28  mg/dL    [] Clinically Significant  [x] Not Clinically Significant   Any further action needed to be taken per the PI?  No  [name], MD, [facility], FACP  [place]  [facility]  Medical Director of the Advanced Lipid Disorders &  Cardiovascular Risk Reduction Clinic Diplomate of the American Board of Clinical Lipidology Attending Cardiologist  Direct Dial: [phone]  Fax: [phone]  Website:  [email or web address]      Mr Thorstenson here for screening retest [name] VS  BP 126/64 HR 62 Resp 16 Temp 97.7 O2sat 96% Taken at 0809 Urine obtained at 0807, and blood drawn at 0815 Mr Roodhyzen reports no ED or Urgent care visits. No abd pain and no changes with his meds. Will call him for next visit once blood results are back.

## 2021-10-15 ENCOUNTER — Encounter: Payer: Self-pay | Admitting: *Deleted

## 2021-10-15 DIAGNOSIS — Z006 Encounter for examination for normal comparison and control in clinical research program: Secondary | ICD-10-CM

## 2021-10-15 NOTE — Research (Addendum)
[employer] 1 screening run in          Chemistry: Insulin 56.9                        [] Clinically Significant  [x] Not Clinically Significant  Hemoglobin A1c 6.1 %      [] Clinically Significant  [x] Not Clinically Significant    Hematology: Hemoglobin  13.1 g/dL           [] Clinically Significant  [x] Not Clinically Significant RDW 14.9  fl                           [] Clinically Significant  [x] Not Clinically Significant Eosinophil 16.5 %                   [] Clinically Significant  [x] Not Clinically Significant Eosinophil (Absolute) 1.3        [] Clinically Significant  [x] Not Clinically Significant   Lipids:  Triglyceride  212    mg/dL          [] Clinically Significant  [x] Not Clinically Significant  I spoke to Mr Lammert about retest #2 due to TG mean below 200 (184)  He states he would like to be retested. Scheduled for him to come in Oct 23.  Any further action needed to be taken per the PI?  No, but agree with re-testing  Pixie Casino, MD, FACC, [place] Director of the [facility]  [facility] Diplomate of the American Board of Clinical Lipidology Attending Cardiologist  Direct Dial: [phone]  Fax: [phone]  Website:  [email or web address]

## 2021-10-16 ENCOUNTER — Ambulatory Visit (INDEPENDENT_AMBULATORY_CARE_PROVIDER_SITE_OTHER): Payer: Medicare PPO

## 2021-10-16 ENCOUNTER — Other Ambulatory Visit: Payer: Self-pay | Admitting: *Deleted

## 2021-10-16 VITALS — Ht 66.0 in | Wt 175.0 lb

## 2021-10-16 DIAGNOSIS — Z0000 Encounter for general adult medical examination without abnormal findings: Secondary | ICD-10-CM | POA: Diagnosis not present

## 2021-10-16 DIAGNOSIS — E782 Mixed hyperlipidemia: Secondary | ICD-10-CM

## 2021-10-16 MED ORDER — ROSUVASTATIN CALCIUM 40 MG PO TABS: 40.0000 mg | ORAL_TABLET | Freq: Every day | ORAL | 0 refills | Status: DC

## 2021-10-16 NOTE — Patient Instructions (Addendum)
[name] , Thank you for taking time to come for your Medicare Wellness Visit. I appreciate your ongoing commitment to your health goals. Please review the following plan we discussed and let me know if I can assist you in the future.   These are the goals we discussed:  Goals       Stay Healthy (pt-stated)      Bring cholesterol down.        This is a list of the screening recommended for you and due dates:  Health Maintenance  Topic Date Due   COVID-19 Vaccine (2 - Moderna series) 11/01/2021*   Zoster (Shingles) Vaccine (1 of 2) 01/16/2022*   Flu Shot  04/05/2022*   Tetanus Vaccine  10/17/2022*   Colon Cancer Screening  11/26/2027   Hepatitis C Screening: USPSTF Recommendation to screen - Ages 18-79 yo.  Completed   HPV Vaccine  Aged Out   Pneumonia Vaccine  Discontinued  *Topic was postponed. The date shown is not the original due date.    Advanced directives: Please bring a copy of your health care power of attorney and living will to the office to be added to your chart at your convenience.   Conditions/risks identified: None  Next appointment: Follow up in one year for your annual wellness visit.    Preventive Care 70 Years and Older, Male  Preventive care refers to lifestyle choices and visits with your health care provider that can promote health and wellness. What does preventive care include? A yearly physical exam. This is also called an annual well check. Dental exams once or twice a year. Routine eye exams. Ask your health care provider how often you should have your eyes checked. Personal lifestyle choices, including: Daily care of your teeth and gums. Regular physical activity. Eating a healthy diet. Avoiding tobacco and drug use. Limiting alcohol use. Practicing safe sex. Taking low doses of aspirin every day. Taking vitamin and mineral supplements as recommended by your health care provider. What happens during an annual well check? The  services and screenings done by your health care provider during your annual well check will depend on your age, overall health, lifestyle risk factors, and family history of disease. Counseling  Your health care provider may ask you questions about your: Alcohol use. Tobacco use. Drug use. Emotional well-being. Home and relationship well-being. Sexual activity. Eating habits. History of falls. Memory and ability to understand (cognition). Work and work Statistician. Screening  You may have the following tests or measurements: Height, weight, and BMI. Blood pressure. Lipid and cholesterol levels. These may be checked every 5 years, or more frequently if you are over 70 years old. Skin check. Lung cancer screening. You may have this screening every year starting at age 70 if you have a 30-pack-year history of smoking and currently smoke or have quit within the past 15 years. Fecal occult blood test (FOBT) of the stool. You may have this test every year starting at age 70. Flexible sigmoidoscopy or colonoscopy. You may have a sigmoidoscopy every 5 years or a colonoscopy every 10 years starting at age 70. Prostate cancer screening. Recommendations will vary depending on your family history and other risks. Hepatitis C blood test. Hepatitis B blood test. Sexually transmitted disease (STD) testing. Diabetes screening. This is done by checking your blood sugar (glucose) after you have not eaten for a while (fasting). You may have this done every 1-3 years. Abdominal aortic aneurysm (AAA) screening. You may need this if you  are a current or former smoker. Osteoporosis. You may be screened starting at age 70 if you are at high risk. Talk with your health care provider about your test results, treatment options, and if necessary, the need for more tests. Vaccines  Your health care provider may recommend certain vaccines, such as: Influenza vaccine. This is recommended every year. Tetanus,  diphtheria, and acellular pertussis (Tdap, Td) vaccine. You may need a Td booster every 10 years. Zoster vaccine. You may need this after age 70. Pneumococcal 13-valent conjugate (PCV13) vaccine. One dose is recommended after age 70. Pneumococcal polysaccharide (PPSV23) vaccine. One dose is recommended after age 70. Talk to your health care provider about which screenings and vaccines you need and how often you need them. This information is not intended to replace advice given to you by your health care provider. Make sure you discuss any questions you have with your health care provider. Document Released: 01/18/2015 Document Revised: 09/11/2015 Document Reviewed: 10/23/2014 Elsevier Interactive Patient Education  2017 [place] Prevention in the Home Falls can cause injuries. They can happen to people of all ages. There are many things you can do to make your home safe and to help prevent falls. What can I do on the outside of my home? Regularly fix the edges of walkways and driveways and fix any cracks. Remove anything that might make you trip as you walk through a door, such as a raised step or threshold. Trim any bushes or trees on the path to your home. Use bright outdoor lighting. Clear any walking paths of anything that might make someone trip, such as rocks or tools. Regularly check to see if handrails are loose or broken. Make sure that both sides of any steps have handrails. Any raised decks and porches should have guardrails on the edges. Have any leaves, snow, or ice cleared regularly. Use sand or salt on walking paths during winter. Clean up any spills in your garage right away. This includes oil or grease spills. What can I do in the bathroom? Use night lights. Install grab bars by the toilet and in the tub and shower. Do not use towel bars as grab bars. Use non-skid mats or decals in the tub or shower. If you need to sit down in the shower, use a plastic,  non-slip stool. Keep the floor dry. Clean up any water that spills on the floor as soon as it happens. Remove soap buildup in the tub or shower regularly. Attach bath mats securely with double-sided non-slip rug tape. Do not have throw rugs and other things on the floor that can make you trip. What can I do in the bedroom? Use night lights. Make sure that you have a light by your bed that is easy to reach. Do not use any sheets or blankets that are too big for your bed. They should not hang down onto the floor. Have a firm chair that has side arms. You can use this for support while you get dressed. Do not have throw rugs and other things on the floor that can make you trip. What can I do in the kitchen? Clean up any spills right away. Avoid walking on wet floors. Keep items that you use a lot in easy-to-reach places. If you need to reach something above you, use a strong step stool that has a grab bar. Keep electrical cords out of the way. Do not use floor polish or wax that makes floors slippery. If you  must use wax, use non-skid floor wax. Do not have throw rugs and other things on the floor that can make you trip. What can I do with my stairs? Do not leave any items on the stairs. Make sure that there are handrails on both sides of the stairs and use them. Fix handrails that are broken or loose. Make sure that handrails are as long as the stairways. Check any carpeting to make sure that it is firmly attached to the stairs. Fix any carpet that is loose or worn. Avoid having throw rugs at the top or bottom of the stairs. If you do have throw rugs, attach them to the floor with carpet tape. Make sure that you have a light switch at the top of the stairs and the bottom of the stairs. If you do not have them, ask someone to add them for you. What else can I do to help prevent falls? Wear shoes that: Do not have high heels. Have rubber bottoms. Are comfortable and fit you well. Are closed  at the toe. Do not wear sandals. If you use a stepladder: Make sure that it is fully opened. Do not climb a closed stepladder. Make sure that both sides of the stepladder are locked into place. Ask someone to hold it for you, if possible. Clearly mark and make sure that you can see: Any grab bars or handrails. First and last steps. Where the edge of each step is. Use tools that help you move around (mobility aids) if they are needed. These include: Canes. Walkers. Scooters. Crutches. Turn on the lights when you go into a dark area. Replace any light bulbs as soon as they burn out. Set up your furniture so you have a clear path. Avoid moving your furniture around. If any of your floors are uneven, fix them. If there are any pets around you, be aware of where they are. Review your medicines with your doctor. Some medicines can make you feel dizzy. This can increase your chance of falling. Ask your doctor what other things that you can do to help prevent falls. This information is not intended to replace advice given to you by your health care provider. Make sure you discuss any questions you have with your health care provider. Document Released: 10/18/2008 Document Revised: 05/30/2015 Document Reviewed: 01/26/2014 Elsevier Interactive Patient Education  2017 [employer].

## 2021-10-16 NOTE — Progress Notes (Signed)
Subjective:   Gregory Obrien is a 71 y.o. male who presents for Medicare Annual/Subsequent preventive examination.  Review of Systems    Virtual Visit via Telephone Note  I connected with  Gregory Obrien on 10/16/21 at  9:15 AM EDT by telephone and verified that I am speaking with the correct person using two identifiers.  Location: Patient: Home Provider: Office Persons participating in the virtual visit: patient/Nurse Health Advisor   I discussed the limitations, risks, security and privacy concerns of performing an evaluation and management service by telephone and the availability of in person appointments. The patient expressed understanding and agreed to proceed.  Interactive audio and video telecommunications were attempted between this nurse and patient, however failed, due to patient having technical difficulties OR patient did not have access to video capability.  We continued and completed visit with audio only.  Some vital signs may be absent or patient reported.   Gregory Rung, LPN  Cardiac Risk Factors include: advanced age (>78men, >72 women)     Objective:    Today's Vitals   10/16/21 0930  Weight: 175 lb (79.4 kg)  Height: 5\' 6"  (1.676 m)   Body mass index is 28.25 kg/m.     10/16/2021    9:37 AM 06/26/2021    9:10 AM 05/15/2021   12:10 PM 05/05/2021    1:18 PM 11/25/2017   10:28 AM  Advanced Directives  Does Patient Have a Medical Advance Directive? Yes Yes Yes No Yes  Type of 11/27/2017 of [place];Living will Healthcare Power of [employer];Living will Healthcare Power of Attorney    Does patient want to make changes to medical advance directive?   No - Patient declined    Copy of Healthcare Power of Attorney in Chart? No - copy requested      Would patient like information on creating a medical advance directive?    No - Patient declined     Current Medications (verified) Outpatient Encounter Medications as of 10/16/2021   Medication Sig   aspirin EC 81 MG tablet Take 1 tablet (81 mg total) by mouth daily. Swallow whole.   cetirizine (ZYRTEC) 10 MG tablet Take 5 mg by mouth every other day.   ezetimibe (ZETIA) 10 MG tablet Take 1 tablet (10 mg total) by mouth daily.   famotidine (PEPCID) 20 MG tablet Take 1 tablet (20 mg total) by mouth 2 (two) times daily.   isosorbide mononitrate (IMDUR) 30 MG 24 hr tablet Take 0.5 tablets (15 mg total) by mouth daily.   metoprolol succinate (TOPROL XL) 25 MG 24 hr tablet Take 0.5 tablets (12.5 mg total) by mouth daily. Please keep scheduled appointment   rosuvastatin (CRESTOR) 40 MG tablet Take 1 tablet (40 mg total) by mouth daily.   sildenafil (REVATIO) 20 MG tablet 2-5 tabs 1 hour prior to intercourse   No facility-administered encounter medications on file as of 10/16/2021.    Allergies (verified) Infliximab   History: Past Medical History:  Diagnosis Date   Blood in stool    H/O arthroscopy of knee    History of colon polyps    UC (ulcerative colitis) (HCC)    Ulcerative colitis (HCC)    remission since 2017, see Dr. 2018   Past Surgical History:  Procedure Laterality Date   BACK SURGERY     COLONOSCOPY WITH PROPOFOL N/A 11/25/2017   Procedure: COLONOSCOPY WITH PROPOFOL;  Surgeon: 11/27/2017, MD;  Location: [facility] ENDOSCOPY;  Service: Gastroenterology;  Laterality: N/A;  KNEE SURGERY     LEFT HEART CATH AND CORONARY ANGIOGRAPHY N/A 05/15/2021   Procedure: LEFT HEART CATH AND CORONARY ANGIOGRAPHY;  Surgeon: Leonie Man, MD;  Location: [place] [facility];  Service: Cardiovascular;  Laterality: N/A;   Family History  Problem Relation Age of Onset   Heart disease Father    Heart attack Father    Drug abuse Brother    Early death Brother    Social History   Socioeconomic History   Marital status: Married    Spouse name: Not on file   Number of children: Not on file   Years of education: Not on file   Highest education level: Not  on file  Occupational History   Not on file  Tobacco Use   Smoking status: Former    Packs/day: 1.00    Years: 10.00    Total pack years: 10.00    Types: Cigarettes    Quit date: 12/07/1990    Years since quitting: 30.8   Smokeless tobacco: Never  Vaping Use   Vaping Use: Never used  Substance and Sexual Activity   Alcohol use: Yes    Alcohol/week: 5.0 standard drinks of alcohol    Types: 5 Cans of beer per week   Drug use: Never   Sexual activity: Yes  Other Topics Concern   Not on file  Social History Narrative   Married to [employer]   Works as Scientist, 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   Has dogs   Enjoys basketball   Social Determinants of Health   Financial Resource Strain: [place]  (10/16/2021)   Overall Financial Resource Strain (CARDIA)    Difficulty of Paying Living Expenses: Not hard at all  Food Insecurity: No Food Insecurity (10/16/2021)   Hunger Vital Sign    Worried About Running Out of Food in the Last Year: Never true    [place] in the Last Year: Never true  Transportation Needs: No Transportation Needs (10/16/2021)   PRAPARE - Hydrologist (Medical): No    Lack of Transportation (Non-Medical): No  Physical Activity: Sufficiently Active (10/16/2021)   Exercise Vital Sign    Days of Exercise per Week: 3 days    Minutes of Exercise per Session: 60 min  Stress: No Stress Concern Present (10/16/2021)   [place]    Feeling of Stress : Not at all  Social Connections: [place] (10/16/2021)   Social Connection and Isolation Panel [NHANES]    Frequency of Communication with Friends and Family: More than three times a week    Frequency of Social Gatherings with Friends and Family: More than three times a week    Attends Religious Services: More than 4 times per year    Active Member of [employer] or Organizations: Yes    Attends Programme researcher, broadcasting/film/video: More than 4 times per year    Marital Status: Married    Tobacco Counseling Counseling given: Not Answered   Clinical Intake:  Pre-visit preparation completed: No  Pain : No/denies pain     BMI - recorded: 28.25 Nutritional Status: BMI 25 -29 Overweight Nutritional Risks: None Diabetes: No  How often do you need to have someone help you when you read instructions, pamphlets, or other written materials from your doctor or pharmacy?: 1 - Never  Diabetic?  No     Information entered by :: Rolene Arbour LPN   Activities  of Daily Living    10/16/2021    9:35 AM 05/15/2021   12:06 PM  In your present state of health, do you have any difficulty performing the following activities:  Hearing? 0 0  Vision? 0 0  Difficulty concentrating or making decisions? 0 0  Walking or climbing stairs? 0 0  Dressing or bathing? 0 0  Doing errands, shopping? 0   Preparing Food and eating ? N   Using the Toilet? N   In the past six months, have you accidently leaked urine? N   Do you have problems with loss of bowel control? N   Managing your Medications? N   Managing your Finances? N   Housekeeping or managing your Housekeeping? N     Patient Care Team: Tower, Audrie Gallus, MD as PCP - General (Family Medicine)  Indicate any recent Medical Services you may have received from other than [facility] providers in the past year (date may be approximate).     Assessment:   This is a routine wellness examination for [place].  Hearing/Vision screen Hearing Screening - Comments:: Denies hearing difficulties   Vision Screening - Comments:: Wears rx glasses - up to date with routine eye exams with  Patient deferred  Dietary issues and exercise activities discussed:     Goals Addressed               This Visit's Progress     Stay Healthy (pt-stated)        Bring cholesterol down.       Depression Screen    10/16/2021    9:34 AM 07/07/2021   11:44 AM 02/06/2020     4:51 PM 10/28/2018    8:26 AM 09/29/2017    8:32 AM 03/29/2017   10:40 AM  PHQ 2/9 Scores  PHQ - 2 Score 0 0 0 0 0 0  PHQ- 9 Score  2 3       Fall Risk    10/16/2021    9:36 AM 06/26/2021    9:08 AM 10/28/2018    8:26 AM 09/29/2017    8:32 AM  Fall Risk   Falls in the past year? 0 0 0 Yes  Number falls in past yr: 0   1  Injury with Fall? 0   No  Risk for fall due to : No Fall Risks Medication side effect    Follow up Falls prevention discussed Education provided;Falls prevention discussed      FALL RISK PREVENTION PERTAINING TO THE HOME:  Any stairs in or around the home? Yes  If so, are there any without handrails? No  Home free of loose throw rugs in walkways, pet beds, electrical cords, etc? Yes  Adequate lighting in your home to reduce risk of falls? Yes   ASSISTIVE DEVICES UTILIZED TO PREVENT FALLS:  Life alert? No  Use of a cane, walker or w/c? No  Grab bars in the bathroom? No  Shower chair or bench in shower? No  Elevated toilet seat or a handicapped toilet? No   TIMED UP AND GO:  Was the test performed? No .    Cognitive Function:        10/16/2021    9:37 AM  6CIT Screen  What Year? 0 points  What month? 0 points  What time? 0 points  Count back from 20 0 points  Months in reverse 0 points  Repeat phrase 0 points  Total Score 0 points    Immunizations Immunization History  Administered Date(s) Administered   Fluad Quad(high Dose 65+) 10/28/2018, 12/07/2019   Influenza Nasal 09/23/2007   Influenza,inj,Quad PF,6+ Mos 09/29/2017   Influenza-Unspecified 09/15/2010, 12/05/2013, 10/05/2016   Moderna Sars-Covid-2 Vaccination 11/09/2019   PPD Test 10/18/2007, 12/20/2007, 12/04/2008, 09/15/2010   Pneumococcal Conjugate-13 02/06/2020    TDAP status: Due, Education has been provided regarding the importance of this vaccine. Advised may receive this vaccine at local pharmacy or Health Dept. Aware to provide a copy of the vaccination record if obtained  from local pharmacy or Health Dept. Verbalized acceptance and understanding.  Flu Vaccine status: Due, Education has been provided regarding the importance of this vaccine. Advised may receive this vaccine at local pharmacy or Health Dept. Aware to provide a copy of the vaccination record if obtained from local pharmacy or Health Dept. Verbalized acceptance and understanding.  Pneumococcal vaccine status: Completed during today's visit.  Covid-19 vaccine status: Completed vaccines  Qualifies for Shingles Vaccine? Yes   Zostavax completed No   Shingrix Completed?: No.    Education has been provided regarding the importance of this vaccine. Patient has been advised to call insurance company to determine out of pocket expense if they have not yet received this vaccine. Advised may also receive vaccine at local pharmacy or Health Dept. Verbalized acceptance and understanding.  Screening Tests Health Maintenance  Topic Date Due   COVID-19 Vaccine (2 - Moderna series) 11/01/2021 (Originally 01/04/2020)   Zoster Vaccines- Shingrix (1 of 2) 01/16/2022 (Originally 11/13/2001)   INFLUENZA VACCINE  04/05/2022 (Originally 08/05/2021)   TETANUS/TDAP  10/17/2022 (Originally [birth year]/14/1970)   COLONOSCOPY (Pts 45-51yrs Insurance coverage will need to be confirmed)  11/26/2027   Hepatitis C Screening  Completed   HPV VACCINES  Aged Out   Pneumonia Vaccine 98+ Years old  Discontinued    Health Maintenance  There are no preventive care reminders to display for this patient.   Colorectal cancer screening: Type of screening: Colonoscopy. Completed 11/25/17. Repeat every 10 years  Lung Cancer Screening: (Low Dose CT Chest recommended if Age 16-80 years, 30 pack-year currently smoking OR have quit w/in 15years.) does not qualify.     Additional Screening:  Hepatitis C Screening: does qualify; Completed 10/28/18  Vision Screening: Recommended annual ophthalmology exams for early detection of glaucoma and  other disorders of the eye. Is the patient up to date with their annual eye exam?  No  Who is the provider or what is the name of the office in which the patient attends annual eye exams? Patient deferred If pt is not established with a provider, would they like to be referred to a provider to establish care? No .   Dental Screening: Recommended annual dental exams for proper oral hygiene  Community Resource Referral / Chronic Care Management:  CRR required this visit?  No   CCM required this visit?  No      Plan:     I have personally reviewed and noted the following in the patient's chart:   Medical and social history Use of alcohol, tobacco or illicit drugs  Current medications and supplements including opioid prescriptions. Patient is not currently taking opioid prescriptions. Functional ability and status Nutritional status Physical activity Advanced directives List of other physicians Hospitalizations, surgeries, and ER visits in previous 12 months Vitals Screenings to include cognitive, depression, and falls Referrals and appointments  In addition, I have reviewed and discussed with patient certain preventive protocols, quality metrics, and best practice recommendations. A written personalized care plan for preventive services  as well as general preventive health recommendations were provided to patient.     Gregory RungBeverly W Doryan Bahl, LPN   16/10/960410/12/2021   Nurse Notes: None

## 2021-10-17 ENCOUNTER — Other Ambulatory Visit: Payer: Self-pay | Admitting: Cardiovascular Disease

## 2021-10-17 DIAGNOSIS — E782 Mixed hyperlipidemia: Secondary | ICD-10-CM

## 2021-10-17 MED ORDER — ROSUVASTATIN CALCIUM 40 MG PO TABS: 40.0000 mg | ORAL_TABLET | Freq: Every day | ORAL | 0 refills | Status: DC

## 2021-10-24 ENCOUNTER — Encounter: Payer: Self-pay | Admitting: *Deleted

## 2021-10-24 DIAGNOSIS — Z006 Encounter for examination for normal comparison and control in clinical research program: Secondary | ICD-10-CM

## 2021-10-24 NOTE — Research (Signed)
Message left to remind Gregory Obrien fo his appointment Monday at 0800. Gave parking code and reminded to be NPO.

## 2021-10-27 ENCOUNTER — Other Ambulatory Visit: Payer: Self-pay

## 2021-10-27 ENCOUNTER — Encounter: Payer: Self-pay | Admitting: *Deleted

## 2021-10-27 ENCOUNTER — Encounter: Payer: Medicare PPO | Admitting: *Deleted

## 2021-10-27 DIAGNOSIS — Z006 Encounter for examination for normal comparison and control in clinical research program: Secondary | ICD-10-CM

## 2021-10-27 NOTE — Research (Signed)
Gregory Obrien is here for re screening visit for [employer]. Blood work drawn at 0800. Urine obtained at [place]. He reports no changes in medications, no abd pain, and no visits to the ED or Urgent care.

## 2021-10-27 NOTE — Research (Signed)
Gregory Obrien here for  [name]  Re Screening visit. Blood drawn at 0800 urine obtained at 0805. Reports no abd pain, no visits to the ED or Urgent care.

## 2021-10-29 ENCOUNTER — Encounter: Payer: Self-pay | Admitting: *Deleted

## 2021-10-29 DIAGNOSIS — Z006 Encounter for examination for normal comparison and control in clinical research program: Secondary | ICD-10-CM

## 2021-10-29 NOTE — Research (Addendum)
 Gregory Obrien [name] Screening run in retest 2 27-Oct-2021      Lipids:  Apolipoprotein B48       1.22 mg/dL   [] Clinically Significant  [x] Not Clinically Significant   Any further action needed to be taken per the PI?  No  [name], MD, [facility], FACP  [place]  [facility]  Medical Director of the Advanced Lipid Disorders &  Cardiovascular Risk Reduction Clinic Diplomate of the American Board of Clinical Lipidology Attending Cardiologist  Direct Dial: [phone]  Fax: [phone]  Website:  [email or web address]              Chemistry: [id number]- equation 65            [] Clinically Significant  [x] Not Clinically Significant  Hs-C Reactive Protein 3.5 mg/L      [] Clinically Significant  [x] Not Clinically Significant Insulin 65.7                                      [] Clinically Significant  [x] Not Clinically Significant   Hematology: Hemoglobin 12.8  g/dL              [] Clinically Significant  [x] Not Clinically Significant Hematocrit 39 %                        [] Clinically Significant  [x] Not Clinically Significant Neutrophil % 38.3                      [] Clinically Significant  [x] Not Clinically Significant Monocyte % 13.1                       [] Clinically Significant  [x] Not Clinically Significant Eosinophil % 20.0                      [] Clinically Significant  [x] Not Clinically Significant Eosinophil (Absolute) 1.2           [] Clinically Significant  [x] Not Clinically Significant  Urinalysis: Blood 0.03  mg/dL                      [] Clinically Significant  [x] Not Clinically Significant Uric Acid crystals Present          [] Clinically Significant  [x] Not Clinically Significant Mucus 1+ per HPF                     [] Clinically Significant  [x] Not Clinically Significant    Lipids:  Triglyceride   147    mg/dL         [] Clinically Significant  [x] Not Clinically Significant Man of Trig 171 mg/dL              [] Clinically Significant  [x] Not  Clinically Significant   Looks like a screen fail. Any further action needed to be taken per the PI?  No  Trigs appear too low to qualify  [name], MD, [facility]  [place]  [facility]  Medical Director of the Advanced Lipid Disorders &  Cardiovascular Risk Reduction Clinic Diplomate of the American Board of Clinical Lipidology Attending Cardiologist  Direct Dial: [phone]  Fax: [phone]  Website:  [email or web address]

## 2021-10-29 NOTE — Research (Signed)
Spoke to Gregory Obrien about his last TG level. Informed him he is a screen failure for the [name] research study, due to his TG mean level of 171. Thanked him for coming in.

## 2021-10-30 NOTE — Research (Addendum)
Gregory Obrien Retest 2 Screening run in 27-Oct-2021

## 2021-10-31 ENCOUNTER — Other Ambulatory Visit (INDEPENDENT_AMBULATORY_CARE_PROVIDER_SITE_OTHER): Payer: Self-pay | Admitting: Vascular Surgery

## 2021-10-31 NOTE — Research (Signed)
Gregory Obrien [name] Screening Run in retest 1 13-Oct-2021

## 2021-11-02 ENCOUNTER — Other Ambulatory Visit: Payer: Self-pay | Admitting: Family Medicine

## 2021-11-03 ENCOUNTER — Other Ambulatory Visit (INDEPENDENT_AMBULATORY_CARE_PROVIDER_SITE_OTHER): Payer: Self-pay | Admitting: Vascular Surgery

## 2021-11-03 ENCOUNTER — Encounter (INDEPENDENT_AMBULATORY_CARE_PROVIDER_SITE_OTHER): Payer: Self-pay

## 2021-11-03 DIAGNOSIS — I714 Abdominal aortic aneurysm, without rupture, unspecified: Secondary | ICD-10-CM

## 2021-11-04 ENCOUNTER — Ambulatory Visit (INDEPENDENT_AMBULATORY_CARE_PROVIDER_SITE_OTHER): Payer: Medicare PPO | Admitting: Nurse Practitioner

## 2021-11-04 ENCOUNTER — Ambulatory Visit (INDEPENDENT_AMBULATORY_CARE_PROVIDER_SITE_OTHER): Payer: Medicare PPO

## 2021-11-04 ENCOUNTER — Encounter (INDEPENDENT_AMBULATORY_CARE_PROVIDER_SITE_OTHER): Payer: Self-pay | Admitting: Vascular Surgery

## 2021-11-04 VITALS — BP 144/75 | HR 62 | Resp 17 | Ht 66.0 in | Wt 176.8 lb

## 2021-11-04 DIAGNOSIS — E7800 Pure hypercholesterolemia, unspecified: Secondary | ICD-10-CM | POA: Diagnosis not present

## 2021-11-04 DIAGNOSIS — I714 Abdominal aortic aneurysm, without rupture, unspecified: Secondary | ICD-10-CM

## 2021-11-10 NOTE — Progress Notes (Signed)
Subjective:    Patient ID: Gregory Obrien, male    DOB: 07-12-1950, 71 y.o.   MRN: [id number] No chief complaint on file.    Patient returns today in follow up of his abdominal aortic aneurysm.  He is doing well today.  He has no complaints relatable to his aneurysm.  No lower abdominal or back pain.  No signs of peripheral embolization.  His AAA duplex today reveals a maximal aortic sac size of 3.32 cm.  This is slightly increased from his study in 2022.    Review of Systems  All other systems reviewed and are negative.      Objective:   Physical Exam Vitals reviewed.  HENT:     Head: Normocephalic.  Cardiovascular:     Rate and Rhythm: Normal rate.     Pulses: Normal pulses.  Pulmonary:     Effort: Pulmonary effort is normal.  Skin:    General: Skin is warm and dry.  Neurological:     Mental Status: He is alert and oriented to person, place, and time.  Psychiatric:        Mood and Affect: Mood normal.        Behavior: Behavior normal.        Thought Content: Thought content normal.        Judgment: Judgment normal.     BP (!) 144/75 (BP Location: Right Arm)   Pulse 62   Resp 17   Ht 5\' 6"  (1.676 m)   Wt 176 lb 12.8 oz (80.2 kg)   BMI 28.54 kg/m   Past Medical History:  Diagnosis Date   Blood in stool    H/O arthroscopy of knee    History of colon polyps    UC (ulcerative colitis) (HCC)    Ulcerative colitis (HCC)    remission since 2017, see Dr. 2018    Social History   Socioeconomic History   Marital status: Married    Spouse name: Not on file   Number of children: Not on file   Years of education: Not on file   Highest education level: Not on file  Occupational History   Not on file  Tobacco Use   Smoking status: Former    Packs/day: 1.00    Years: 10.00    Total pack years: 10.00    Types: Cigarettes    Quit date: 12/07/1990    Years since quitting: 30.9   Smokeless tobacco: Never  Vaping Use   Vaping Use: Never used  Substance  and Sexual Activity   Alcohol use: Yes    Alcohol/week: 5.0 standard drinks of alcohol    Types: 5 Cans of beer per week   Drug use: Never   Sexual activity: Yes  Other Topics Concern   Not on file  Social History Narrative   Married to 14/02/1990   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, 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   Has dogs   Enjoys basketball   Social Determinants of Health   Financial Resource Strain: Low Risk  (10/16/2021)   Overall Financial Resource Strain (CARDIA)    Difficulty of Paying Living Expenses: Not hard at all  Food Insecurity: No Food Insecurity (10/16/2021)   Hunger Vital Sign    Worried About Running Out of Food in the Last Year: Never true    Ran Out of Food in the Last Year: Never true  Transportation Needs: No Transportation Needs (10/16/2021)   PRAPARE - 12/16/2021 (Medical): No  Lack of Transportation (Non-Medical): No  Physical Activity: Sufficiently Active (10/16/2021)   Exercise Vital Sign    Days of Exercise per Week: 3 days    Minutes of Exercise per Session: 60 min  Stress: No Stress Concern Present (10/16/2021)   [place]    Feeling of Stress : Not at all  Social Connections: [place] (10/16/2021)   Social Connection and Isolation Panel [NHANES]    Frequency of Communication with Friends and Family: More than three times a week    Frequency of Social Gatherings with Friends and Family: More than three times a week    Attends Religious Services: More than 4 times per year    Active Member of [employer] or Organizations: Yes    Attends Archivist Meetings: More than 4 times per year    Marital Status: Married  Human resources officer Violence: Not At Risk (10/16/2021)   Humiliation, Afraid, Rape, and Kick questionnaire    Fear of Current or Ex-Partner: No    Emotionally Abused: No    Physically Abused: No    Sexually Abused: No    Past  Surgical History:  Procedure Laterality Date   BACK SURGERY     COLONOSCOPY WITH PROPOFOL N/A 11/25/2017   Procedure: COLONOSCOPY WITH PROPOFOL;  Surgeon: Lin Landsman, MD;  Location: [place];  Service: Gastroenterology;  Laterality: N/A;   KNEE SURGERY     LEFT HEART CATH AND CORONARY ANGIOGRAPHY N/A 05/15/2021   Procedure: LEFT HEART CATH AND CORONARY ANGIOGRAPHY;  Surgeon: Leonie Man, MD;  Location: [facility];  Service: Cardiovascular;  Laterality: N/A;    Family History  Problem Relation Age of Onset   Heart disease Father    Heart attack Father    Drug abuse Brother    Early death Brother     Allergies  Allergen Reactions   Infliximab Hives    ([place])       Latest Ref Rng & Units 05/05/2021    1:21 PM 10/28/2018    8:57 AM 03/29/2017   11:24 AM  CBC  WBC 4.0 - 10.5 K/uL 7.7  8.2  6.7   Hemoglobin 13.0 - 17.0 g/dL 14.3  14.2  13.4   Hematocrit 39.0 - 52.0 % 44.1  43.4  40.4   Platelets 150 - 400 K/uL 248  320.0  278.0       CMP     Component Value Date/Time   NA 138 05/05/2021 1321   K 4.5 05/05/2021 1321   CL 100 05/05/2021 1321   CO2 28 05/05/2021 1321   GLUCOSE 100 (H) 05/05/2021 1321   BUN 20 05/05/2021 1321   CREATININE 1.26 (H) 05/05/2021 1321   CALCIUM 10.5 (H) 05/05/2021 1321   PROT 7.7 02/06/2020 1650   ALBUMIN 4.7 02/06/2020 1650   AST 21 06/24/2021 0839   ALT 18 06/24/2021 0839   ALKPHOS 49 02/06/2020 1650   BILITOT 0.4 02/06/2020 1650   GFRNONAA >60 05/05/2021 1321     No results found.     Assessment & Plan:   1. Abdominal aortic aneurysm (AAA) without rupture, unspecified part ([place]) His AAA duplex today reveals a maximal aortic sac size of 3.32 cm.  This is slightly increased from his study in 2022. No changes. Avoid tobacco and maintain normal BP to avoid AAA growth. Recheck in 18 months.   2. Pure hypercholesterolemia Continue statin as ordered and reviewed, no changes at this time  Current  Outpatient Medications on File Prior to Visit  Medication Sig Dispense Refill   aspirin EC 81 MG tablet Take 1 tablet (81 mg total) by mouth daily. Swallow whole. 360 tablet 0   cetirizine (ZYRTEC) 10 MG tablet Take 5 mg by mouth every other day.     ezetimibe (ZETIA) 10 MG tablet Take 1 tablet (10 mg total) by mouth daily. 90 tablet 3   famotidine (PEPCID) 20 MG tablet TAKE 1 TABLET BY MOUTH TWICE A DAY 180 tablet 0   isosorbide mononitrate (IMDUR) 30 MG 24 hr tablet Take 0.5 tablets (15 mg total) by mouth daily. 45 tablet 0   metoprolol succinate (TOPROL XL) 25 MG 24 hr tablet Take 0.5 tablets (12.5 mg total) by mouth daily. Please keep scheduled appointment 30 tablet 1   rosuvastatin (CRESTOR) 40 MG tablet Take 1 tablet (40 mg total) by mouth daily. 90 tablet 0   No current facility-administered medications on file prior to visit.    There are no Patient Instructions on file for this visit. No follow-ups on file.   Georgiana Spinner, NP

## 2021-11-11 ENCOUNTER — Encounter (INDEPENDENT_AMBULATORY_CARE_PROVIDER_SITE_OTHER): Payer: Self-pay | Admitting: Nurse Practitioner

## 2021-12-04 ENCOUNTER — Other Ambulatory Visit: Payer: Self-pay | Admitting: Cardiovascular Disease

## 2021-12-04 MED ORDER — ISOSORBIDE MONONITRATE ER 30 MG PO TB24: 15.0000 mg | ORAL_TABLET | Freq: Every day | ORAL | 0 refills | Status: DC

## 2021-12-19 NOTE — Progress Notes (Unsigned)
Cardiology Office Note  Date:  12/22/2021   ID:  York Harcum, DOB [birth year], MRN [id number]  PCP:  Abner Greenspan, MD   Chief Complaint  Patient presents with   6 month follow up     "Doing well." Medications reviewed by the patient verbally.     HPI:  Mr. Jeton Sibbett is a 71 year old gentleman with past medical history of CAD Former smoker 10 years Hyperlipidemia COVID in January 2023 Ulcerative colitis Abdominal aortic aneurysm followed by vascular, 3.2 cm Seen in the emergency room May 05, 2021 for chest pain Who presents today for follow-up of his coronary artery disease, angina  Last seen by myself in clinic June 2023 In follow-up today reports feeling well Has completed cardiac rehab, does exercises at home Continues to have difficulty tolerating isosorbide secondary to headaches, slowly getting better Denies significant chest pain on exertion In general reports relatively good exercise tolerance  EKG personally reviewed by myself on todays visit Normal sinus rhythm rate 60 bpm right bundle branch block no significant ST-T wave changes  Prior studies reviewed Cardiac CTA showing stenosis Cardiac catheterization May 15, 2021, results as below   Prox RCA lesion is 100% stenosed.   The mid to distal RCA fills via collaterals from the left system: Mid RCA-1 lesion is 95% stenosed. Mid RCA-2 lesion is 80% stenosed.  Mid LAD lesion is 40% stenosed.   Ost LAD lesion is 40% stenosed.   The left ventricular systolic function is normal.  The left ventricular ejection fraction is 55-65% by visual estimate.   LV end diastolic pressure is normal.   There is no aortic valve stenosis.   SUMMARY Severe Single-Vessel CAD with 100% CTO with a proximal RCA with brisk collaterals filling via AVG LCx, OM1 and distal LAD Mild to moderate ostial and proximal LAD-not flow-limiting.   Normal LVEF and normal EDP.    Other past medical history reviewed Cardiac CTA performed May 12 2021,   results discussed in detail, images pulled up and reviewed 1. Coronary calcium score of 954. This was 85th percentile for age and sex matched control. 2. Normal coronary origin with right dominance. 3. Calcified plaque in the proximal to mid RCA causing severe stenosis (>70%). 4. Calcified plaque in the proximal to mid LAD causing moderate stenosis (50-69%). 5. CAD-RADS 4 Severe stenosis. Cardiac catheterization is recommended.  Seen in the emergency room May 05, 2021, had a sandwich from [employer] shortly after developed pain radiating through to his back Enzymes negative D-dimer normal, low risk for PE Pain left of sternum,  Total cholesterol greater than 230, has been off the Crestor A1c 6.2, prediabetic range  Father with pacer   PMH:   has a past medical history of Blood in stool, H/O arthroscopy of knee, History of colon polyps, UC (ulcerative colitis) ([place]), and Ulcerative colitis ([place]).  PSH:    Past Surgical History:  Procedure Laterality Date   BACK SURGERY     COLONOSCOPY WITH PROPOFOL N/A 11/25/2017   Procedure: COLONOSCOPY WITH PROPOFOL;  Surgeon: Lin Landsman, MD;  Location: [facility];  Service: Gastroenterology;  Laterality: N/A;   KNEE SURGERY     LEFT HEART CATH AND CORONARY ANGIOGRAPHY N/A 05/15/2021   Procedure: LEFT HEART CATH AND CORONARY ANGIOGRAPHY;  Surgeon: Leonie Man, MD;  Location: [place] [facility];  Service: Cardiovascular;  Laterality: N/A;    Current Outpatient Medications  Medication Sig Dispense Refill   aspirin EC 81 MG tablet Take 1  tablet (81 mg total) by mouth daily. Swallow whole. 360 tablet 0   famotidine (PEPCID) 20 MG tablet TAKE 1 TABLET BY MOUTH TWICE A DAY 180 tablet 0   cetirizine (ZYRTEC) 10 MG tablet Take 5 mg by mouth every other day. (Patient not taking: Reported on 12/22/2021)     ezetimibe (ZETIA) 10 MG tablet Take 1 tablet (10 mg total) by mouth daily. 90 tablet 3   isosorbide mononitrate (IMDUR)  30 MG 24 hr tablet Take 0.5 tablets (15 mg total) by mouth daily. 45 tablet 3   metoprolol succinate (TOPROL XL) 25 MG 24 hr tablet Take 0.5 tablets (12.5 mg total) by mouth daily. Please keep scheduled appointment 45 tablet 3   rosuvastatin (CRESTOR) 40 MG tablet Take 1 tablet (40 mg total) by mouth daily. 90 tablet 3   No current facility-administered medications for this visit.   Allergies:   Infliximab   Social History:  The patient  reports that he quit smoking about 31 years ago. His smoking use included cigarettes. He has a 10.00 pack-year smoking history. He has never used smokeless tobacco. He reports current alcohol use of about 5.0 standard drinks of alcohol per week. He reports that he does not use drugs.   Family History:   family history includes Drug abuse in his brother; Early death in his brother; Heart attack in his father; Heart disease in his father.    Review of Systems: Review of Systems  Constitutional: Negative.   HENT: Negative.    Respiratory: Negative.    Cardiovascular: Negative.   Gastrointestinal: Negative.   Musculoskeletal: Negative.   Neurological: Negative.   Psychiatric/Behavioral: Negative.    All other systems reviewed and are negative.   PHYSICAL EXAM: VS:  BP 108/60 (BP Location: Left Arm, Patient Position: Sitting, Cuff Size: Normal)   Pulse 60   Ht 5\' 7"  (1.702 m)   Wt 174 lb (78.9 kg)   SpO2 98%   BMI 27.25 kg/m  , BMI Body mass index is 27.25 kg/m. Constitutional:  oriented to person, place, and time. No distress.  HENT:  Head: Grossly normal Eyes:  no discharge. No scleral icterus.  Neck: No JVD, no carotid bruits  Cardiovascular: Regular rate and rhythm, no murmurs appreciated Pulmonary/Chest: Clear to auscultation bilaterally, no wheezes or rails Abdominal: Soft.  no distension.  no tenderness.  Musculoskeletal: Normal range of motion Neurological:  normal muscle tone. Coordination normal. No atrophy Skin: Skin warm and  dry Psychiatric: normal affect, pleasant  Recent Labs: 05/05/2021: BUN 20; Creatinine, Ser 1.26; Hemoglobin 14.3; Platelets 248; Potassium 4.5; Sodium 138 06/24/2021: ALT 18    Lipid Panel Lab Results  Component Value Date   CHOL 192 06/24/2021   HDL 37.90 (L) 06/24/2021   LDLCALC 125 (H) 02/06/2020   TRIG 271.0 (H) 06/24/2021    Wt Readings from Last 3 Encounters:  12/22/21 174 lb (78.9 kg)  11/04/21 176 lb 12.8 oz (80.2 kg)  10/16/21 175 lb (79.4 kg)     ASSESSMENT AND PLAN:  Problem List Items Addressed This Visit       Cardiology Problems   CAD (coronary artery disease) - Primary   Relevant Medications   ezetimibe (ZETIA) 10 MG tablet   isosorbide mononitrate (IMDUR) 30 MG 24 hr tablet   metoprolol succinate (TOPROL XL) 25 MG 24 hr tablet   rosuvastatin (CRESTOR) 40 MG tablet   Other Relevant Orders   EKG 12-Lead   Hyperlipidemia   Relevant Medications   ezetimibe (  ZETIA) 10 MG tablet   isosorbide mononitrate (IMDUR) 30 MG 24 hr tablet   metoprolol succinate (TOPROL XL) 25 MG 24 hr tablet   rosuvastatin (CRESTOR) 40 MG tablet   Other Relevant Orders   EKG 12-Lead   Lipid panel   AAA (abdominal aortic aneurysm) (HCC)   Relevant Medications   ezetimibe (ZETIA) 10 MG tablet   isosorbide mononitrate (IMDUR) 30 MG 24 hr tablet   metoprolol succinate (TOPROL XL) 25 MG 24 hr tablet   rosuvastatin (CRESTOR) 40 MG tablet   Other Relevant Orders   EKG 12-Lead     Other   Prediabetes   Relevant Orders   EKG 12-Lead   Chronic stable angina Occluded RCA with collaterals from left to right Currently with no symptoms of angina. No further workup at this time. Continue current medication regimen.  Hyperlipidemia Crestor 40 daily,Zetia Goal LDL less than 70  Essential hypertension Blood pressure is well controlled on today's visit. No changes made to the medications.    Total encounter time more than 30 minutes  Greater than 50% was spent in counseling and  coordination of care with the patient    Signed, Dossie Arbour, M.D., Ph.D. [facility] [place], [place] [phone]

## 2021-12-22 ENCOUNTER — Encounter: Payer: Self-pay | Admitting: Cardiovascular Disease

## 2021-12-22 ENCOUNTER — Ambulatory Visit: Payer: Medicare PPO | Attending: Cardiovascular Disease | Admitting: Cardiovascular Disease

## 2021-12-22 VITALS — BP 108/60 | HR 60 | Ht 67.0 in | Wt 174.0 lb

## 2021-12-22 DIAGNOSIS — R7303 Prediabetes: Secondary | ICD-10-CM

## 2021-12-22 DIAGNOSIS — I714 Abdominal aortic aneurysm, without rupture, unspecified: Secondary | ICD-10-CM | POA: Diagnosis not present

## 2021-12-22 DIAGNOSIS — E782 Mixed hyperlipidemia: Secondary | ICD-10-CM | POA: Diagnosis not present

## 2021-12-22 DIAGNOSIS — I25118 Atherosclerotic heart disease of native coronary artery with other forms of angina pectoris: Secondary | ICD-10-CM | POA: Diagnosis not present

## 2021-12-22 MED ORDER — EZETIMIBE 10 MG PO TABS: 10.0000 mg | ORAL_TABLET | Freq: Every day | ORAL | 3 refills | Status: DC

## 2021-12-22 MED ORDER — METOPROLOL SUCCINATE ER 25 MG PO TB24: 12.5000 mg | ORAL_TABLET | Freq: Every day | ORAL | 3 refills | Status: DC

## 2021-12-22 MED ORDER — ISOSORBIDE MONONITRATE ER 30 MG PO TB24: 15.0000 mg | ORAL_TABLET | Freq: Every day | ORAL | 3 refills | Status: DC

## 2021-12-22 MED ORDER — ROSUVASTATIN CALCIUM 40 MG PO TABS: 40.0000 mg | ORAL_TABLET | Freq: Every day | ORAL | 3 refills | Status: DC

## 2021-12-22 NOTE — Patient Instructions (Addendum)
Medication Instructions:  No changes  If you need a refill on your cardiac medications before your next appointment, please call your pharmacy.   Lab work: Lipid panel, fasting, at [facility]  Testing/Procedures: No new testing needed  Follow-Up: At [facility], you and your health needs are our priority.  As part of our continuing mission to provide you with exceptional heart care, we have created designated Provider Care Teams.  These Care Teams include your primary Cardiologist (physician) and Advanced Practice Providers (APPs -  Physician Assistants and Nurse Practitioners) who all work together to provide you with the care you need, when you need it.  You will need a follow up appointment in 12 months  Providers on your designated Care Team:   Nicolasa Ducking, NP Eula Listen, PA-C Cadence Fransico [place], [place]  COVID-19 Vaccine Information can be found at: [email or web address] For questions related to vaccine distribution or appointments, please email vaccine@ [email or web address] or call [phone].

## 2022-01-08 ENCOUNTER — Other Ambulatory Visit: Payer: Self-pay

## 2022-01-08 DIAGNOSIS — E782 Mixed hyperlipidemia: Secondary | ICD-10-CM

## 2022-01-08 MED ORDER — ROSUVASTATIN CALCIUM 40 MG PO TABS: 40.0000 mg | ORAL_TABLET | Freq: Every day | ORAL | 3 refills | Status: DC

## 2022-04-01 ENCOUNTER — Other Ambulatory Visit: Payer: Self-pay | Admitting: *Deleted

## 2022-04-01 MED ORDER — FAMOTIDINE 20 MG PO TABS: 20.0000 mg | ORAL_TABLET | Freq: Two times a day (BID) | ORAL | 0 refills | Status: DC

## 2022-05-27 ENCOUNTER — Ambulatory Visit (INDEPENDENT_AMBULATORY_CARE_PROVIDER_SITE_OTHER): Payer: Medicare PPO | Admitting: Family Medicine

## 2022-05-27 ENCOUNTER — Encounter: Payer: Self-pay | Admitting: Family Medicine

## 2022-05-27 VITALS — BP 110/62 | HR 67 | Temp 98.0°F | Ht 67.0 in | Wt 178.5 lb

## 2022-05-27 DIAGNOSIS — I25118 Atherosclerotic heart disease of native coronary artery with other forms of angina pectoris: Secondary | ICD-10-CM | POA: Diagnosis not present

## 2022-05-27 DIAGNOSIS — K5150 Left sided colitis without complications: Secondary | ICD-10-CM

## 2022-05-27 DIAGNOSIS — L989 Disorder of the skin and subcutaneous tissue, unspecified: Secondary | ICD-10-CM

## 2022-05-27 DIAGNOSIS — E782 Mixed hyperlipidemia: Secondary | ICD-10-CM

## 2022-05-27 DIAGNOSIS — I714 Abdominal aortic aneurysm, without rupture, unspecified: Secondary | ICD-10-CM | POA: Diagnosis not present

## 2022-05-27 NOTE — Assessment & Plan Note (Signed)
Doing well , stable No angina currently  Sees cardiology/ Dr Mariah Milling  H/o occluded RCA with collaterals  Continues medical regimen incl metoprolol 12.5 mg daily  Imdur 15 mg daily   Zetia and crestor for lipid control Good habits  Well controlled bp Due for labs today

## 2022-05-27 NOTE — Assessment & Plan Note (Signed)
No symptoms Size mildly increase to 3.32 cm in the fall  Reviewed vascular note from fall  Planning re check in 18 months

## 2022-05-27 NOTE — Patient Instructions (Addendum)
I want to set you up  with dermatology I put the referral in  Please let us know if you don't hear in 1-2 weeks    Wear a hat  Wear sun protection   Take care of yourself   Lab today for cholesterol

## 2022-05-27 NOTE — Progress Notes (Signed)
Subjective:    Patient ID: Gregory Obrien, male    DOB: [birth year], 72 y.o.   MRN: [id number]  HPI Pt presents for a bothersome spot on his head   Wt Readings from Last 3 Encounters:  05/27/22 178 lb 8 oz (81 kg)  12/22/21 174 lb (78.9 kg)  11/04/21 176 lb 12.8 oz (80.2 kg)   27.96 kg/m  Vitals:   05/27/22 1413  BP: 110/62  Pulse: 67  Temp: 98 F (36.7 C)  SpO2: 95%   Not traumatic  Bump on his head  Noted about 10 days ago   (had just been to [place])  Not painful /not itchy   No personal h/o skin cancer   Sun protection- usually wears a hat  Lot of sun when younger - some burns    Mother had psoriasis  He never had a problems    CAD Last visit with Dr Mariah Milling was in December  Good bp and cholesterol control  Per note:  Chronic stable angina Occluded RCA with collaterals from left to right Currently with no symptoms of angina. No further workup at this time. Continue current medication regimen.  Doing ok overall/no new symptoms   No issues with colitis   Father had melanoma on scalp in his 90s   Is due for a lipid panel -needs that today  Has not eaten since 5 am   Lab Results  Component Value Date   CHOL 192 06/24/2021   HDL 37.90 (L) 06/24/2021   LDLCALC 125 (H) 02/06/2020   LDLDIRECT 114.0 06/24/2021   TRIG 271.0 (H) 06/24/2021   CHOLHDL 5 06/24/2021    Crestor 40 mg daily  Zetia 10 mg daily   Avoiding red meat Did cheat a bit on recent vacation  Needs labs sent to Dr Mariah Milling   Also h/o AAA Sees vascular Per visit in the fall: . Abdominal aortic aneurysm (AAA) without rupture, unspecified part (HCC) His AAA duplex today reveals a maximal aortic sac size of 3.32 cm.  This is slightly increased from his study in 2022. No changes. Avoid tobacco and maintain normal BP to avoid AAA growth. Recheck in 18 months.   Patient Active Problem List   Diagnosis Date Noted   Skin lesion of scalp 05/27/2022   Stable angina    Abnormal cardiac CT  angiography    CAD (coronary artery disease) 05/13/2021   GERD (gastroesophageal reflux disease) 05/08/2021   Cough 02/17/2021   Prediabetes 02/06/2020   Hyperlipidemia 02/06/2020   Erectile dysfunction due to arterial insufficiency 03/22/2019   Trochanteric bursitis of left hip 11/23/2017   AAA (abdominal aortic aneurysm) (HCC) 10/07/2017   Left sided colitis without complications (HCC)    Low back pain 07/22/2016   Low back strain 07/22/2016   Past Medical History:  Diagnosis Date   Blood in stool    H/O arthroscopy of knee    History of colon polyps    UC (ulcerative colitis) (HCC)    Ulcerative colitis (HCC)    remission since 2017, see Dr. Beatrix Fetters   Past Surgical History:  Procedure Laterality Date   BACK SURGERY     COLONOSCOPY WITH PROPOFOL N/A 11/25/2017   Procedure: COLONOSCOPY WITH PROPOFOL;  Surgeon: Toney Reil, MD;  Location: [facility] ENDOSCOPY;  Service: Gastroenterology;  Laterality: N/A;   KNEE SURGERY     LEFT HEART CATH AND CORONARY ANGIOGRAPHY N/A 05/15/2021   Procedure: LEFT HEART CATH AND CORONARY ANGIOGRAPHY;  Surgeon: Marykay Lex, MD;  Location: [facility] INVASIVE CV LAB;  Service: Cardiovascular;  Laterality: N/A;   Social History   Tobacco Use   Smoking status: Former    Packs/day: 1.00    Years: 10.00    Additional pack years: 0.00    Total pack years: 10.00    Types: Cigarettes    Quit date: 12/07/1990    Years since quitting: 31.4   Smokeless tobacco: Never  Vaping Use   Vaping Use: Never used  Substance Use Topics   Alcohol use: Yes    Alcohol/week: 5.0 standard drinks of alcohol    Types: 5 Cans of beer per week   Drug use: Never   Family History  Problem Relation Age of Onset   Heart disease Father    Heart attack Father    Drug abuse Brother    Early death Brother    Allergies  Allergen Reactions   Infliximab Hives    (REMICADE)   Current Outpatient Medications on File Prior to Visit  Medication Sig Dispense Refill    aspirin EC 81 MG tablet Take 81 mg by mouth daily. Swallow whole.     ezetimibe (ZETIA) 10 MG tablet Take 1 tablet (10 mg total) by mouth daily. 90 tablet 3   famotidine (PEPCID) 20 MG tablet Take 1 tablet (20 mg total) by mouth 2 (two) times daily. 180 tablet 0   isosorbide mononitrate (IMDUR) 30 MG 24 hr tablet Take 0.5 tablets (15 mg total) by mouth daily. 45 tablet 3   metoprolol succinate (TOPROL XL) 25 MG 24 hr tablet Take 0.5 tablets (12.5 mg total) by mouth daily. Please keep scheduled appointment 45 tablet 3   rosuvastatin (CRESTOR) 40 MG tablet Take 1 tablet (40 mg total) by mouth daily. 90 tablet 3   No current facility-administered medications on file prior to visit.    Review of Systems  Constitutional:  Negative for activity change, appetite change, fatigue, fever and unexpected weight change.  HENT:  Negative for congestion, rhinorrhea, sore throat and trouble swallowing.   Eyes:  Negative for pain, redness, itching and visual disturbance.  Respiratory:  Negative for cough, chest tightness, shortness of breath and wheezing.   Cardiovascular:  Negative for chest pain and palpitations.  Gastrointestinal:  Negative for abdominal pain, blood in stool, constipation, diarrhea and nausea.  Endocrine: Negative for cold intolerance, heat intolerance, polydipsia and polyuria.  Genitourinary:  Negative for difficulty urinating, dysuria, frequency and urgency.  Musculoskeletal:  Negative for arthralgias, joint swelling and myalgias.  Skin:  Negative for pallor and rash.       Skin lesion on scalp  Neurological:  Negative for dizziness, tremors, weakness, numbness and headaches.  Hematological:  Negative for adenopathy. Does not bruise/bleed easily.  Psychiatric/Behavioral:  Negative for decreased concentration and dysphoric mood. The patient is not nervous/anxious.        Objective:   Physical Exam Constitutional:      General: He is not in acute distress.    Appearance: Normal  appearance. He is normal weight. He is not ill-appearing or diaphoretic.  HENT:     Head: Normocephalic and atraumatic.  Eyes:     Conjunctiva/sclera: Conjunctivae normal.     Pupils: Pupils are equal, round, and reactive to light.  Cardiovascular:     Rate and Rhythm: Normal rate and regular rhythm.  Pulmonary:     Effort: Pulmonary effort is normal. No respiratory distress.     Breath sounds: Normal breath sounds. No wheezing or rales.  Abdominal:  General: Abdomen is flat.  Musculoskeletal:     Cervical back: Neck supple.  Lymphadenopathy:     Cervical: No cervical adenopathy.  Skin:    General: Skin is warm and dry.     Findings: Lesion present. No erythema.     Comments: 1 by 1.1 area of raised skin on R scalp with scant rough texture (skin colored) resembling sk  Several surrounding smaller areas of flat scale w/o color on scalp consistent with AKs  Notable scattered tan to flesh colored sks on back  Fair complexion  Neurological:     Mental Status: He is alert.  Psychiatric:        Mood and Affect: Mood normal.           Assessment & Plan:   Problem List Items Addressed This Visit       Cardiovascular and Mediastinum   AAA (abdominal aortic aneurysm) (HCC)    No symptoms Size mildly increase to 3.32 cm in the fall  Reviewed vascular note from fall  Planning re check in 18 months       Relevant Medications   aspirin EC 81 MG tablet   CAD (coronary artery disease)    Doing well , stable No angina currently  Sees cardiology/ Dr Mariah Milling  H/o occluded RCA with collaterals  Continues medical regimen incl metoprolol 12.5 mg daily  Imdur 15 mg daily   Zetia and crestor for lipid control Good habits  Well controlled bp Due for labs today      Relevant Medications   aspirin EC 81 MG tablet     Digestive   Left sided colitis without complications (HCC)    Stable Still in remission/ no symptoms         Musculoskeletal and Integument   Skin  lesion of scalp - Primary    R scalp= has lesion consistent with SK (raised, skin color and some scale) Surrounding are several areas of flat scale w/o color - may be AKs H/o sun exp in past and fair complexion (now wears a hat and sun protection)  Father had melanoma in his 56s    Will ref to derm for further eval      Relevant Orders   Ambulatory referral to Dermatology     Other   Hyperlipidemia    Due for labs in setting of CAD Reviewed last note from Dr Mariah Milling   Disc goals for lipids and reasons to control them Rev last labs with pt Rev low sat fat diet in detail Takes crestor 40 mg daily and zetia 10 mg daily  Tolerates well  Diet is good- though did just go on vacation Lipid panel today      Relevant Medications   aspirin EC 81 MG tablet   Other Relevant Orders   Lipid panel   ALT   AST

## 2022-05-27 NOTE — Assessment & Plan Note (Signed)
Due for labs in setting of CAD Reviewed last note from Dr Mariah Milling   Disc goals for lipids and reasons to control them Rev last labs with pt Rev low sat fat diet in detail Takes crestor 40 mg daily and zetia 10 mg daily  Tolerates well  Diet is good- though did just go on vacation Lipid panel today

## 2022-05-27 NOTE — Assessment & Plan Note (Signed)
Stable Still in remission/ no symptoms

## 2022-05-27 NOTE — Assessment & Plan Note (Signed)
R scalp= has lesion consistent with SK (raised, skin color and some scale) Surrounding are several areas of flat scale w/o color - may be AKs H/o sun exp in past and fair complexion (now wears a hat and sun protection)  Father had melanoma in his 15s    Will ref to derm for further eval

## 2022-05-28 ENCOUNTER — Encounter: Payer: Self-pay | Admitting: Family Medicine

## 2022-05-28 LAB — LIPID PANEL
Cholesterol: 141 mg/dL (ref 0–200)
HDL: 38.2 mg/dL — ABNORMAL LOW (ref >39.00)
LDL Cholesterol: 69 mg/dL (ref 0–99)
NonHDL: 102.55
Total CHOL/HDL Ratio: 4
Triglycerides: 168 mg/dL — ABNORMAL HIGH (ref 0.0–149.0)
VLDL: 33.6 mg/dL (ref 0.0–40.0)

## 2022-05-28 LAB — AST: AST: 22 U/L (ref 0–37)

## 2022-05-28 LAB — ALT: ALT: 18 U/L (ref 0–53)

## 2022-09-09 ENCOUNTER — Ambulatory Visit (INDEPENDENT_AMBULATORY_CARE_PROVIDER_SITE_OTHER): Payer: Medicare PPO | Admitting: Dermatology

## 2022-09-09 ENCOUNTER — Encounter: Payer: Self-pay | Admitting: Dermatology

## 2022-09-09 VITALS — BP 121/76 | HR 55

## 2022-09-09 DIAGNOSIS — L821 Other seborrheic keratosis: Secondary | ICD-10-CM | POA: Diagnosis not present

## 2022-09-09 DIAGNOSIS — L578 Other skin changes due to chronic exposure to nonionizing radiation: Secondary | ICD-10-CM | POA: Diagnosis not present

## 2022-09-09 DIAGNOSIS — L814 Other melanin hyperpigmentation: Secondary | ICD-10-CM

## 2022-09-09 DIAGNOSIS — Z1283 Encounter for screening for malignant neoplasm of skin: Secondary | ICD-10-CM | POA: Diagnosis not present

## 2022-09-09 NOTE — Progress Notes (Signed)
New Patient Visit   Subjective  Gregory Obrien is a 72 y.o. male who presents for the following: spots on the scalp  Patient states he has spots located on the scalp that he would like to have examined. Patient reports the areas have been there for several months. He reports the areas are not bothersome. He states that he was unaware of the spots until his PCP pointed them out. He initially saw his PCP for another spot that was on his scalp that was a bump at the time of hios visit in May but has now resolved.Patient reports he has not previously been treated for these areas. Patient has no Hx of skin cancer. Patient has family history of skin cancer--father had MM.  The following portions of the chart were reviewed this encounter and updated as appropriate: medications, allergies, medical history  Review of Systems:  No other skin or systemic complaints except as noted in HPI or Assessment and Plan.  Objective  Well appearing patient in no apparent distress; mood and affect are within normal limits.   A focused examination was performed of the following areas: Scalp, face, ears, neck  Relevant exam findings are noted in the Assessment and Plan.    Assessment & Plan   SEBORRHEIC KERATOSIS - Multiple Stuck-on, waxy, tan-brown papules and/or plaques  - Benign-appearing - Discussed benign etiology and prognosis. - Observe - Call for any changes   LENTIGINES Exam: scattered tan macules Due to sun exposure Treatment Plan: Benign-appearing, observe. Recommend daily broad spectrum sunscreen SPF 30+ to sun-exposed areas, reapply every 2 hours as needed.  Call for any changes   ACTINIC DAMAGE WITH PRECANCEROUS ACTINIC KERATOSES Counseling for Topical Chemotherapy Management: Patient exhibits: - Severe, confluent actinic changes with pre-cancerous actinic keratoses that is secondary to cumulative UV radiation exposure over time - Condition that is severe; chronic, not at goal. -  diffuse scaly erythematous macules and papules with underlying dyspigmentation - Discussed Prescription "Field Treatment" topical Chemotherapy for Severe, Chronic Confluent Actinic Changes with Pre-Cancerous Actinic Keratoses Field treatment involves treatment of an entire area of skin that has confluent Actinic Changes (Sun/ Ultraviolet light damage) and PreCancerous Actinic Keratoses by method of PhotoDynamic Therapy (PDT) and/or prescription Topical Chemotherapy agents such as 5-fluorouracil, 5-fluorouracil/calcipotriene, and/or imiquimod.  The purpose is to decrease the number of clinically evident and subclinical PreCancerous lesions to prevent progression to development of skin cancer by chemically destroying early precancer changes that may or may not be visible.  It has been shown to reduce the risk of developing skin cancer in the treated area. As a result of treatment, redness, scaling, crusting, and open sores may occur during treatment course. One or more than one of these methods may be used and may have to be used several times to control, suppress and eliminate the PreCancerous changes. Discussed treatment course, expected reaction, and possible side effects. - Recommend daily broad spectrum sunscreen SPF 30+ to sun-exposed areas, reapply every 2 hours as needed.  - Staying in the shade or wearing long sleeves, sun glasses (UVA+UVB protection) and wide brim hats (4-inch brim around the entire circumference of the hat) are also recommended. - Call for new or changing lesions.     Resolved Scalp Growth (? Cyst) - Assessment: The previously noted dime-sized growth on the patient's scalp has mostly resolved. No signs of skin cancer were found during the examination. - Plan: Instructed the patient to monitor for any regrowth or pain, and to return if needed.  Surgical removal can be considered if it becomes bothersome.-no residual cyst today   Return for TBSE.  [name], CMA, am  acting as scribe for [employer], DO.   Documentation: I have reviewed the above documentation for accuracy and completeness, and I agree with the above.  Langston Reusing, DO

## 2022-09-09 NOTE — Patient Instructions (Addendum)
Hello Gregory Obrien,  Thank you for visiting my office today. Your proactive approach towards your skin health is greatly appreciated. Below is a summary of the essential points from our consultation:  - Observations: The growth on your scalp has significantly reduced in size and shows no signs of skin cancer. Additionally, multiple seborrheic keratoses were observed, which are benign and commonly occur with aging.  - Treatment Options: Should the seborrheic keratoses become bothersome, we can consider treatment with liquid nitrogen.  - Future Considerations: In the event the previously noted cyst reappears and causes discomfort, we may consider surgical removal.  - Skin Examination: Your face and ears were examined with no issues found. There were no signs of skin cancer or pre-cancers detected, although mild actinic damage was noted.  - Preventive Care: It is recommended to schedule a full body check in the future to monitor any changes due to cumulative sun exposure.  - Self-Monitoring: Please keep an eye on your skin for any new or changing lesions, such as non-healing pimples or growing dark moles, and return for evaluation if these occur.  - Educational Materials and Products: You were provided with a goodie bag containing sunscreens and moisturizers to assist in managing and protecting your skin.  Should you have any questions or wish to schedule your next appointment, please do not hesitate to contact our office.  Best regards,  Dr. Langston Reusing,  Dermatology    Important Information  Due to recent changes in healthcare laws, you may see results of your pathology and/or laboratory studies on MyChart before the doctors have had a chance to review them. We understand that in some cases there may be results that are confusing or concerning to you. Please understand that not all results are received at the same time and often the doctors may need to interpret multiple results in  order to provide you with the best plan of care or course of treatment. Therefore, we ask that you please give [place] 2 business days to thoroughly review all your results before contacting the office for clarification. Should we see a critical lab result, you will be contacted sooner.   If You Need Anything After Your Visit  If you have any questions or concerns for your doctor, please call our main line at [phone] If no one answers, please leave a voicemail as directed and we will return your call as soon as possible. Messages left after 4 pm will be answered the following business day.   You may also send [place] a message via MyChart. We typically respond to MyChart messages within 1-2 business days.  For prescription refills, please ask your pharmacy to contact our office. Our fax number is [phone].  If you have an urgent issue when the clinic is closed that cannot wait until the next business day, you can page your doctor at the number below.    Please note that while we do our best to be available for urgent issues outside of office hours, we are not available 24/7.   If you have an urgent issue and are unable to reach [place], you may choose to seek medical care at your doctor's office, retail clinic, urgent care center, or emergency room.  If you have a medical emergency, please immediately call 911 or go to the emergency department. In the event of inclement weather, please call our main line at [phone] for an update on the status of any delays or closures.  Dermatology Medication Tips: Please  keep the boxes that topical medications come in in order to help keep track of the instructions about where and how to use these. Pharmacies typically print the medication instructions only on the boxes and not directly on the medication tubes.   If your medication is too expensive, please contact our office at [phone] or send [place] a message through MyChart.   We are unable to tell what  your co-pay for medications will be in advance as this is different depending on your insurance coverage. However, we may be able to find a substitute medication at lower cost or fill out paperwork to get insurance to cover a needed medication.   If a prior authorization is required to get your medication covered by your insurance company, please allow [place] 1-2 business days to complete this process.  Drug prices often vary depending on where the prescription is filled and some pharmacies may offer cheaper prices.  The website www.goodrx.com contains coupons for medications through different pharmacies. The prices here do not account for what the cost may be with help from insurance (it may be cheaper with your insurance), but the website can give you the price if you did not use any insurance.  - You can print the associated coupon and take it with your prescription to the pharmacy.  - You may also stop by our office during regular business hours and pick up a GoodRx coupon card.  - If you need your prescription sent electronically to a different pharmacy, notify our office through [facility] or by phone at [phone]

## 2022-09-23 DIAGNOSIS — H35412 Lattice degeneration of retina, left eye: Secondary | ICD-10-CM | POA: Diagnosis not present

## 2022-09-23 DIAGNOSIS — H02831 Dermatochalasis of right upper eyelid: Secondary | ICD-10-CM | POA: Diagnosis not present

## 2022-09-23 DIAGNOSIS — H2513 Age-related nuclear cataract, bilateral: Secondary | ICD-10-CM | POA: Diagnosis not present

## 2022-09-23 DIAGNOSIS — H35033 Hypertensive retinopathy, bilateral: Secondary | ICD-10-CM | POA: Diagnosis not present

## 2022-09-23 DIAGNOSIS — Z0100 Encounter for examination of eyes and vision without abnormal findings: Secondary | ICD-10-CM | POA: Diagnosis not present

## 2022-10-10 ENCOUNTER — Other Ambulatory Visit: Payer: Self-pay | Admitting: Cardiovascular Disease

## 2022-10-28 ENCOUNTER — Other Ambulatory Visit: Payer: Self-pay | Admitting: Cardiovascular Disease

## 2022-10-28 DIAGNOSIS — E782 Mixed hyperlipidemia: Secondary | ICD-10-CM

## 2022-10-30 ENCOUNTER — Other Ambulatory Visit: Payer: Self-pay | Admitting: Cardiovascular Disease

## 2022-10-30 NOTE — Telephone Encounter (Signed)
last visit:  12/22/21 with plan to f/u in 12 months.  Next visit: none/active recall

## 2022-11-04 ENCOUNTER — Ambulatory Visit: Payer: Medicare PPO

## 2022-11-04 ENCOUNTER — Ambulatory Visit: Payer: Medicare PPO | Admitting: Dermatology

## 2022-11-04 VITALS — Ht 67.0 in | Wt 175.0 lb

## 2022-11-04 DIAGNOSIS — K219 Gastro-esophageal reflux disease without esophagitis: Secondary | ICD-10-CM | POA: Diagnosis not present

## 2022-11-04 DIAGNOSIS — Z0000 Encounter for general adult medical examination without abnormal findings: Secondary | ICD-10-CM

## 2022-11-04 MED ORDER — FAMOTIDINE 20 MG PO TABS: 20.0000 mg | ORAL_TABLET | Freq: Two times a day (BID) | ORAL | 0 refills | Status: DC

## 2022-11-04 NOTE — Patient Instructions (Signed)
Gregory Obrien , Thank you for taking time to come for your Medicare Wellness Visit. I appreciate your ongoing commitment to your health goals. Please review the following plan we discussed and let me know if I can assist you in the future.   Referrals/Orders/Follow-Ups/Clinician Recommendations: none  This is a list of the screening recommended for you and due dates:  Health Maintenance  Topic Date Due   COVID-19 Vaccine (2 - Moderna risk series) 11/20/2022*   Zoster (Shingles) Vaccine (1 of 2) 02/04/2023*   Flu Shot  04/05/2023*   DTaP/Tdap/Td vaccine (1 - Tdap) 11/04/2023*   Medicare Annual Wellness Visit  11/04/2023   Colon Cancer Screening  11/26/2027   Hepatitis C Screening  Completed   HPV Vaccine  Aged Out   Pneumonia Vaccine  Discontinued  *Topic was postponed. The date shown is not the original due date.    Advanced directives: (Copy Requested) Please bring a copy of your health care power of attorney and living will to the office to be added to your chart at your convenience.  Next Medicare Annual Wellness Visit scheduled for next year: No Pt moving out of area

## 2022-11-04 NOTE — Progress Notes (Signed)
Subjective:   Gregory Obrien is a 72 y.o. male who presents for Medicare Annual/Subsequent preventive examination.  Visit Complete: Virtual I connected with  Bashar Metzinger on 11/04/22 by a audio enabled telemedicine application and verified that I am speaking with the correct person using two identifiers.  Patient Location: Home  Provider Location: Home Office  I discussed the limitations of evaluation and management by telemedicine. The patient expressed understanding and agreed to proceed.  Vital Signs: Because this visit was a virtual/telehealth visit, some criteria may be missing or patient reported. Any vitals not documented were not able to be obtained and vitals that have been documented are patient reported.  Patient Medicare AWV questionnaire was completed by the patient on (not done); I have confirmed that all information answered by patient is correct and no changes since this date.  Cardiac Risk Factors include: advanced age (>69men, >17 women);dyslipidemia;male gender    Objective:    Today's Vitals   11/04/22 1304 11/04/22 1305  Weight: 175 lb (79.4 kg)   Height: 5\' 7"  (1.702 m)   PainSc:  2    Body mass index is 27.41 kg/m.     11/04/2022    1:17 PM 10/16/2021    9:37 AM 06/26/2021    9:10 AM 05/15/2021   12:10 PM 05/05/2021    1:18 PM 11/25/2017   10:28 AM  Advanced Directives  Does Patient Have a Medical Advance Directive? Yes Yes Yes Yes No Yes  Type of Estate agent of [place];Living will Healthcare Power of [place];Living will Healthcare Power of [place];Living will Healthcare Power of Attorney    Does patient want to make changes to medical advance directive?    No - Patient declined    Copy of Healthcare Power of Attorney in Chart? No - copy requested No - copy requested      Would patient like information on creating a medical advance directive?     No - Patient declined     Current Medications (verified) Outpatient  Encounter Medications as of 11/04/2022  Medication Sig   aspirin EC 81 MG tablet Take 81 mg by mouth daily. Swallow whole.   ezetimibe (ZETIA) 10 MG tablet Take 1 tablet (10 mg total) by mouth daily.   famotidine (PEPCID) 20 MG tablet Take 1 tablet (20 mg total) by mouth 2 (two) times daily.   isosorbide mononitrate (IMDUR) 30 MG 24 hr tablet Take 0.5 tablets (15 mg total) by mouth daily. Due yearly follow up visit in December 2024.  PLEASE CALL OFFICE TO SCHEDULE APPOINTMENT PRIOR TO NEXT REFILL   metoprolol succinate (TOPROL-XL) 25 MG 24 hr tablet Take 0.5 tablets (12.5 mg total) by mouth daily.   rosuvastatin (CRESTOR) 40 MG tablet TAKE 1 TABLET EVERY DAY   No facility-administered encounter medications on file as of 11/04/2022.   Allergies (verified) Infliximab   History: Past Medical History:  Diagnosis Date   Blood in stool    H/O arthroscopy of knee    History of colon polyps    UC (ulcerative colitis) (HCC)    Ulcerative colitis (HCC)    remission since 2017, see Dr. Beatrix Fetters   Past Surgical History:  Procedure Laterality Date   BACK SURGERY     COLONOSCOPY WITH PROPOFOL N/A 11/25/2017   Procedure: COLONOSCOPY WITH PROPOFOL;  Surgeon: Toney Reil, MD;  Location: [facility] ENDOSCOPY;  Service: Gastroenterology;  Laterality: N/A;   KNEE SURGERY     LEFT HEART CATH AND CORONARY ANGIOGRAPHY N/A 05/15/2021  Procedure: LEFT HEART CATH AND CORONARY ANGIOGRAPHY;  Surgeon: Marykay Lex, MD;  Location: [facility] INVASIVE CV LAB;  Service: Cardiovascular;  Laterality: N/A;   Family History  Problem Relation Age of Onset   Heart disease Father    Heart attack Father    Drug abuse Brother    Early death Brother    Social History   Socioeconomic History   Marital status: Married    Spouse name: Not on file   Number of children: Not on file   Years of education: Not on file   Highest education level: Bachelor's degree (e.g., BA, AB, BS)  Occupational History   Not on file   Tobacco Use   Smoking status: Former    Current packs/day: 0.00    Average packs/day: 1 pack/day for 10.0 years (10.0 ttl pk-yrs)    Types: Cigarettes    Start date: 12/06/1980    Quit date: 12/07/1990    Years since quitting: 31.9   Smokeless tobacco: Never  Vaping Use   Vaping status: Never Used  Substance and Sexual Activity   Alcohol use: Yes    Alcohol/week: 5.0 standard drinks of alcohol    Types: 5 Cans of beer per week   Drug use: Never   Sexual activity: Yes  Other Topics Concern   Not on file  Social History Narrative   Married to [employer]   Works as Production designer, 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   Has dogs   Enjoys basketball   Social Determinants of Health   Financial Resource Strain: Low Risk  (11/04/2022)   Overall Financial Resource Strain (CARDIA)    Difficulty of Paying Living Expenses: Not hard at all  Food Insecurity: No Food Insecurity (11/04/2022)   Hunger Vital Sign    Worried About Running Out of Food in the Last Year: Never true    Ran Out of Food in the Last Year: Never true  Transportation Needs: No Transportation Needs (11/04/2022)   PRAPARE - Administrator, Civil Service (Medical): No    Lack of Transportation (Non-Medical): No  Physical Activity: Insufficiently Active (11/04/2022)   Exercise Vital Sign    Days of Exercise per Week: 3 days    Minutes of Exercise per Session: 30 min  Stress: No Stress Concern Present (11/04/2022)   [employer] of Occupational Health - Occupational Stress Questionnaire    Feeling of Stress : Only a little  Social Connections: Socially Integrated (11/04/2022)   Social Connection and Isolation Panel [NHANES]    Frequency of Communication with Friends and Family: Three times a week    Frequency of Social Gatherings with Friends and Family: Twice a week    Attends Religious Services: More than 4 times per year    Active Member of [employer] or Organizations: Yes    Attends Hospital doctor: More than 4 times per year    Marital Status: Married    Tobacco Counseling Counseling given: Not Answered  Clinical Intake:  Pre-visit preparation completed: No  Pain : 0-10 Pain Score: 2  Pain Type: Chronic pain Pain Location: Back Pain Orientation: Lower Pain Descriptors / Indicators: Burning Pain Onset: More than a month ago Pain Frequency: Constant Pain Relieving Factors: walking  Pain Relieving Factors: walking  BMI - recorded: 27.41 Nutritional Status: BMI 25 -29 Overweight Nutritional Risks: None Diabetes: No  How often do you need to have someone help you when you read instructions, pamphlets, or other written materials from your doctor  or pharmacy?: 1 - Never  Interpreter Needed?: No  Comments: lives with wife Information entered by :: B.Roby Donaway,LPN   Activities of Daily Living    11/04/2022    1:18 PM  In your present state of health, do you have any difficulty performing the following activities:  Hearing? 0  Vision? 0  Difficulty concentrating or making decisions? 1  Walking or climbing stairs? 0  Dressing or bathing? 0  Doing errands, shopping? 0  Preparing Food and eating ? N  Using the Toilet? N  In the past six months, have you accidently leaked urine? N  Do you have problems with loss of bowel control? N  Managing your Medications? N  Managing your Finances? N  Housekeeping or managing your Housekeeping? N    Patient Care Team: Tower, Audrie Gallus, MD as PCP - General (Family Medicine) [facility], [facility] (Optometry)  Indicate any recent Medical Services you may have received from other than [facility] providers in the past year (date may be approximate).     Assessment:   This is a routine wellness examination for [place].  Hearing/Vision screen Hearing Screening - Comments:: Pt says his hearing is good Vision Screening - Comments:: Pt says his vision is good with new glasses [facility]   Goals Addressed                This Visit's Progress     COMPLETED: Stay Healthy (pt-stated)   On track     Bring cholesterol down.       Depression Screen    11/04/2022    1:15 PM 05/27/2022    2:21 PM 10/16/2021    9:34 AM 07/07/2021   11:44 AM 02/06/2020    4:51 PM 10/28/2018    8:26 AM 09/29/2017    8:32 AM  PHQ 2/9 Scores  PHQ - 2 Score 0 0 0 0 0 0 0  PHQ- 9 Score  3  2 3       Fall Risk    11/04/2022    1:08 PM 05/27/2022    2:21 PM 10/16/2021    9:36 AM 06/26/2021    9:08 AM 10/28/2018    8:26 AM  Fall Risk   Falls in the past year? 0 0 0 0 0  Number falls in past yr: 0 0 0    Injury with Fall? 0 0 0    Risk for fall due to : No Fall Risks No Fall Risks No Fall Risks Medication side effect   Follow up Falls prevention discussed;Education provided Falls evaluation completed Falls prevention discussed Education provided;Falls prevention discussed     MEDICARE RISK AT HOME: Medicare Risk at Home Any stairs in or around the home?: No If so, are there any without handrails?: No Home free of loose throw rugs in walkways, pet beds, electrical cords, etc?: Yes Adequate lighting in your home to reduce risk of falls?: Yes Life alert?: No Use of a cane, walker or w/c?: No Grab bars in the bathroom?: Yes Shower chair or bench in shower?: No Elevated toilet seat or a handicapped toilet?: No  TIMED UP AND GO:  Was the test performed?  No    Cognitive Function:        11/04/2022    1:25 PM 10/16/2021    9:37 AM  6CIT Screen  What Year? 0 points 0 points  What month? 0 points 0 points  What time? 0 points 0 points  Count back from 20 0 points  0 points  Months in reverse 0 points 0 points  Repeat phrase 0 points 0 points  Total Score 0 points 0 points    Immunizations Immunization History  Administered Date(s) Administered   Fluad Quad(high Dose 65+) 10/28/2018, 12/07/2019   Influenza Nasal 09/23/2007   Influenza,inj,Quad PF,6+ Mos 09/29/2017   Influenza-Unspecified 09/15/2010, 12/05/2013,  10/05/2016   Moderna Sars-Covid-2 Vaccination 11/09/2019   PPD Test 10/18/2007, 12/20/2007, 12/04/2008, 09/15/2010   Pneumococcal Conjugate-13 02/06/2020    TDAP status: Up to date  Flu Vaccine status: Due, Education has been provided regarding the importance of this vaccine. Advised may receive this vaccine at local pharmacy or Health Dept. Aware to provide a copy of the vaccination record if obtained from local pharmacy or Health Dept. Verbalized acceptance and understanding.  Pneumococcal vaccine status: Up to date  Covid-19 vaccine status: Completed vaccines  Qualifies for Shingles Vaccine? Yes   Zostavax completed No   Shingrix Completed?: No.    Education has been provided regarding the importance of this vaccine. Patient has been advised to call insurance company to determine out of pocket expense if they have not yet received this vaccine. Advised may also receive vaccine at local pharmacy or Health Dept. Verbalized acceptance and understanding.  Screening Tests Health Maintenance  Topic Date Due   COVID-19 Vaccine (2 - Moderna risk series) 11/20/2022 (Originally 12/07/2019)   Zoster Vaccines- Shingrix (1 of 2) 02/04/2023 (Originally [birth year]14/1969)   INFLUENZA VACCINE  04/05/2023 (Originally 08/06/2022)   DTaP/Tdap/Td (1 - Tdap) 11/04/2023 (Originally 11/14/1970)   Medicare Annual Wellness (AWV)  11/04/2023   Colonoscopy  11/26/2027   Hepatitis C Screening  Completed   HPV VACCINES  Aged Out   Pneumonia Vaccine 23+ Years old  Discontinued    Health Maintenance  There are no preventive care reminders to display for this patient.   Colorectal cancer screening: Type of screening: Colonoscopy. Completed 11/26/2014. Repeat every 10 years  Lung Cancer Screening: (Low Dose CT Chest recommended if Age 59-80 years, 20 pack-year currently smoking OR have quit w/in 15years.) does not qualify.   Lung Cancer Screening Referral: no  Additional Screening:  Hepatitis C Screening: does  not qualify; Completed 10/28/2018  Vision Screening: Recommended annual ophthalmology exams for early detection of glaucoma and other disorders of the eye. Is the patient up to date with their annual eye exam?  Yes  Who is the provider or what is the name of the office in which the patient attends annual eye exams? [facility] If pt is not established with a provider, would they like to be referred to a provider to establish care? No .   Dental Screening: Recommended annual dental exams for proper oral hygiene  Diabetic Foot Exam:   Community Resource Referral / Chronic Care Management: CRR required this visit?  No   CCM required this visit?  Appt scheduled with PCP   Plan:     I have personally reviewed and noted the following in the patient's chart:   Medical and social history Use of alcohol, tobacco or illicit drugs  Current medications and supplements including opioid prescriptions. Patient is not currently taking opioid prescriptions. Functional ability and status Nutritional status Physical activity Advanced directives List of other physicians Hospitalizations, surgeries, and ER visits in previous 12 months Vitals Screenings to include cognitive, depression, and falls Referrals and appointments  In addition, I have reviewed and discussed with patient certain preventive protocols, quality metrics, and best practice recommendations. A written personalized care plan for preventive  services as well as general preventive health recommendations were provided to patient.     Sue Lush, LPN   [id number]   After Visit Summary: (MyChart) Due to this being a telephonic visit, the after visit summary with patients personalized plan was offered to patient via MyChart   Nurse Notes: Pt states he doing alright. He states he has been having a few dizzy spells and more forgetful. Pt relays he needs a refill of Pepcid.

## 2022-11-04 NOTE — Addendum Note (Signed)
Addended by: Sue Lush on: 11/04/2022 04:14 PM   Modules accepted: Orders

## 2022-11-09 ENCOUNTER — Ambulatory Visit (INDEPENDENT_AMBULATORY_CARE_PROVIDER_SITE_OTHER): Payer: Medicare PPO | Admitting: Family Medicine

## 2022-11-09 ENCOUNTER — Encounter: Payer: Self-pay | Admitting: Family Medicine

## 2022-11-09 VITALS — BP 118/70 | HR 85 | Temp 98.0°F | Ht 67.0 in | Wt 182.5 lb

## 2022-11-09 DIAGNOSIS — R052 Subacute cough: Secondary | ICD-10-CM

## 2022-11-09 DIAGNOSIS — R42 Dizziness and giddiness: Secondary | ICD-10-CM | POA: Diagnosis not present

## 2022-11-09 MED ORDER — BENZONATATE 100 MG PO CAPS: 100.0000 mg | ORAL_CAPSULE | Freq: Three times a day (TID) | ORAL | 1 refills | Status: AC | PRN

## 2022-11-09 NOTE — Assessment & Plan Note (Signed)
This comes and goes  Worse after flare of allergies or uri  Encouraged to increase zyrtec to 10 mg daily  Encouraged to start back on generic pepcid 20 mg bid also for possible acid reflux adding Tessalon refilled for prn use  Reviewed last chest imaging- reassuring

## 2022-11-09 NOTE — Patient Instructions (Addendum)
Go up on zyrtec to 10 mg daily - at bedtime   You can also add flonase or nasacort nasal spray over the counter   Change position slowly   If symptoms worsen or fail to improve let us know   I sent in tessalon for cough  Also start back on generic pepcid (acid reflux causes cough)  If cough does not improve let us know
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
## 2022-11-09 NOTE — Assessment & Plan Note (Signed)
Intermittent in the past  More frequent lately  Episodes are brief/not long enough for meclizine  Reassuring exam/no neuro changes No other neuro symptoms  Expect this is peripheral  Suspect this is ear related  Discussed getting back on allergies medicine/helped in past , will increase zyrtec from 5 to 10 mg daily Encouraged to add a steroid ns over the counter also  Change position slowly /caution not to fall  If not improved -consider PT / and /or ENT follow up and possibly imaging

## 2022-11-13 ENCOUNTER — Other Ambulatory Visit (INDEPENDENT_AMBULATORY_CARE_PROVIDER_SITE_OTHER): Payer: Medicare PPO

## 2022-11-13 ENCOUNTER — Telehealth (INDEPENDENT_AMBULATORY_CARE_PROVIDER_SITE_OTHER): Payer: Self-pay | Admitting: Family Medicine

## 2022-11-13 DIAGNOSIS — E782 Mixed hyperlipidemia: Secondary | ICD-10-CM

## 2022-11-13 DIAGNOSIS — R7303 Prediabetes: Secondary | ICD-10-CM

## 2022-11-13 LAB — COMPREHENSIVE METABOLIC PANEL
ALT: 19 U/L (ref 0–53)
AST: 22 U/L (ref 0–37)
Albumin: 4.6 g/dL (ref 3.5–5.2)
Alkaline Phosphatase: 49 U/L (ref 39–117)
BUN: 16 mg/dL (ref 6–23)
CO2: 27 meq/L (ref 19–32)
Calcium: 9.6 mg/dL (ref 8.4–10.5)
Chloride: 104 meq/L (ref 96–112)
Creatinine, Ser: 1.29 mg/dL (ref 0.40–1.50)
GFR: 55.99 mL/min — ABNORMAL LOW (ref >60.00)
Glucose, Bld: 91 mg/dL (ref 70–99)
Potassium: 4.1 meq/L (ref 3.5–5.1)
Sodium: 139 meq/L (ref 135–145)
Total Bilirubin: 0.5 mg/dL (ref 0.2–1.2)
Total Protein: 7.4 g/dL (ref 6.0–8.3)

## 2022-11-13 LAB — LIPID PANEL
Cholesterol: 137 mg/dL (ref 0–200)
HDL: 45.4 mg/dL (ref >39.00)
LDL Cholesterol: 71 mg/dL (ref 0–99)
NonHDL: 91.96
Total CHOL/HDL Ratio: 3
Triglycerides: 105 mg/dL (ref 0.0–149.0)
VLDL: 21 mg/dL (ref 0.0–40.0)

## 2022-11-13 LAB — HEMOGLOBIN A1C: Hgb A1c MFr Bld: 6.5 % (ref 4.6–6.5)

## 2022-11-13 LAB — TSH: TSH: 3.13 u[IU]/mL (ref 0.35–5.50)

## 2022-11-13 NOTE — Telephone Encounter (Signed)
Lab orders

## 2022-11-14 ENCOUNTER — Other Ambulatory Visit: Payer: Self-pay | Admitting: Cardiovascular Disease

## 2022-11-16 NOTE — Telephone Encounter (Signed)
last visit 12/22/21 with plan to f/u in  12 months.    next visit:  none/active recal

## 2022-12-09 ENCOUNTER — Other Ambulatory Visit: Payer: Self-pay | Admitting: Cardiovascular Disease

## 2022-12-21 NOTE — Progress Notes (Signed)
Cardiology Office Note  Date:  12/22/2022   ID:  Gregory Obrien, DOB [birth year], MRN [id number]  PCP:  Judy Pimple, MD   Chief Complaint  Patient presents with   12 month follow up     Patient c/o chest pain/tightness at times that comes and goes with exertion. Medications reviewed by the patient verbally.     HPI:  [name] is a 72 year old gentleman with past medical history of CAD Former smoker 10 years Hyperlipidemia COVID in January 2023 Ulcerative colitis Abdominal aortic aneurysm followed by vascular, 3.2 cm Seen in the emergency room May 05, 2021 for chest pain Cardiac catheterization May 2023, occluded proximal RCA with collaterals left-to-right, 40% LAD Who presents today for follow-up of his coronary artery disease, angina  Last seen by myself in clinic December 2023  [place] No chest pain, rare chest tightness,  Does not feel it is progressing, seems random  Active recently, getting ready to move 2 hours [place] Wife has a new job past during her church He will probably pick up a new cardiac group in the area Stress as they are getting ready to move  No PND orthopnea, no leg swelling Compliant with his medications  Lab work reviewed LDL 70, total cholesterol 469  EKG personally reviewed by myself on todays visit EKG Interpretation Date/Time:  Tuesday December 22 2022 10:11:26 EST Ventricular Rate:  52 PR Interval:  150 QRS Duration:  130 QT Interval:  440 QTC Calculation: 409 R Axis:   35  Text Interpretation: Sinus bradycardia Right bundle branch block When compared with ECG of 05-May-2021 13:14, Questionable change in QRS axis T wave inversion no longer evident in Anterior leads Confirmed by Julien Nordmann [id number]) on 12/22/2022 10:24:06 AM    Prior studies reviewed Cardiac CTA showing stenosis Cardiac catheterization May 15, 2021, results as below   Prox RCA lesion is 100% stenosed.   The mid to distal RCA fills via  collaterals from the left system: Mid RCA-1 lesion is 95% stenosed. Mid RCA-2 lesion is 80% stenosed.  Mid LAD lesion is 40% stenosed.   Ost LAD lesion is 40% stenosed.   The left ventricular systolic function is normal.  The left ventricular ejection fraction is 55-65% by visual estimate.   LV end diastolic pressure is normal.   There is no aortic valve stenosis.   SUMMARY Severe Single-Vessel CAD with 100% CTO with a proximal RCA with brisk collaterals filling via AVG LCx, OM1 and distal LAD Mild to moderate ostial and proximal LAD-not flow-limiting.   Normal LVEF and normal EDP.    Other past medical history reviewed Cardiac CTA performed May 12 2021,   results discussed in detail, images pulled up and reviewed 1. Coronary calcium score of 954. This was 85th percentile for age and sex matched control. 2. Normal coronary origin with right dominance. 3. Calcified plaque in the proximal to mid RCA causing severe stenosis (>70%). 4. Calcified plaque in the proximal to mid LAD causing moderate stenosis (50-69%). 5. CAD-RADS 4 Severe stenosis. Cardiac catheterization is recommended.  Seen in the emergency room May 05, 2021, had a sandwich from [employer] shortly after developed pain radiating through to his back Enzymes negative D-dimer normal, low risk for PE Pain left of sternum,  Total cholesterol greater than 230, has been off the Crestor A1c 6.2, prediabetic range  Father with pacer   PMH:   has a past medical history of Blood in stool, H/O arthroscopy of knee,  History of colon polyps, UC (ulcerative colitis) (HCC), and Ulcerative colitis (HCC).  PSH:    Past Surgical History:  Procedure Laterality Date   BACK SURGERY     COLONOSCOPY WITH PROPOFOL N/A 11/25/2017   Procedure: COLONOSCOPY WITH PROPOFOL;  Surgeon: Toney Reil, MD;  Location: [facility] ENDOSCOPY;  Service: Gastroenterology;  Laterality: N/A;   KNEE SURGERY     LEFT HEART CATH AND CORONARY ANGIOGRAPHY N/A  05/15/2021   Procedure: LEFT HEART CATH AND CORONARY ANGIOGRAPHY;  Surgeon: Marykay Lex, MD;  Location: [facility] INVASIVE CV LAB;  Service: Cardiovascular;  Laterality: N/A;    Current Outpatient Medications  Medication Sig Dispense Refill   aspirin EC 81 MG tablet Take 81 mg by mouth daily. Swallow whole.     ezetimibe (ZETIA) 10 MG tablet Take 1 tablet (10 mg total) by mouth daily. Please keep upcoming appointment for future refills 30 tablet 11   famotidine (PEPCID) 20 MG tablet Take 1 tablet (20 mg total) by mouth 2 (two) times daily. 180 tablet 0   isosorbide mononitrate (IMDUR) 30 MG 24 hr tablet Take 0.5 tablets (15 mg total) by mouth daily. Due yearly follow up visit in December 2024.  PLEASE CALL OFFICE TO SCHEDULE APPOINTMENT PRIOR TO NEXT REFILL 45 tablet 0   metoprolol succinate (TOPROL-XL) 25 MG 24 hr tablet Take 0.5 tablets (12.5 mg total) by mouth daily. 45 tablet 0   rosuvastatin (CRESTOR) 40 MG tablet TAKE 1 TABLET EVERY DAY 90 tablet 0   benzonatate (TESSALON) 100 MG capsule Take 1 capsule (100 mg total) by mouth 3 (three) times daily as needed for cough. Swallow cough (Patient not taking: Reported on 12/22/2022) 30 capsule 1   cetirizine (ZYRTEC) 10 MG tablet Take 10 mg by mouth daily. (Patient not taking: Reported on 12/22/2022)     No current facility-administered medications for this visit.   Allergies:   Infliximab   Social History:  The patient  reports that he quit smoking about 32 years ago. His smoking use included cigarettes. He started smoking about 42 years ago. He has a 10 pack-year smoking history. He has never used smokeless tobacco. He reports current alcohol use of about 5.0 standard drinks of alcohol per week. He reports that he does not use drugs.   Family History:   family history includes Drug abuse in his brother; Early death in his brother; Heart attack in his father; Heart disease in his father.    Review of Systems: Review of Systems   Constitutional: Negative.   HENT: Negative.    Respiratory: Negative.    Cardiovascular: Negative.   Gastrointestinal: Negative.   Musculoskeletal: Negative.   Neurological: Negative.   Psychiatric/Behavioral: Negative.    All other systems reviewed and are negative.   PHYSICAL EXAM: VS:  BP 130/70 (BP Location: Left Arm, Patient Position: Sitting, Cuff Size: Normal)   Pulse (!) 52   Ht 5\' 6"  (1.676 m)   Wt 181 lb 8 oz (82.3 kg)   SpO2 95%   BMI 29.29 kg/m  , BMI Body mass index is 29.29 kg/m. Constitutional:  oriented to person, place, and time. No distress.  HENT:  Head: Grossly normal Eyes:  no discharge. No scleral icterus.  Neck: No JVD, no carotid bruits  Cardiovascular: Regular rate and rhythm, no murmurs appreciated Pulmonary/Chest: Clear to auscultation bilaterally, no wheezes or rails Abdominal: Soft.  no distension.  no tenderness.  Musculoskeletal: Normal range of motion Neurological:  normal muscle tone. Coordination normal. No  atrophy Skin: Skin warm and dry Psychiatric: normal affect, pleasant  Recent Labs: 11/13/2022: ALT 19; BUN 16; Creatinine, Ser 1.29; Potassium 4.1; Sodium 139; TSH 3.13    Lipid Panel Lab Results  Component Value Date   CHOL 137 11/13/2022   HDL 45.40 11/13/2022   LDLCALC 71 11/13/2022   TRIG 105.0 11/13/2022    Wt Readings from Last 3 Encounters:  12/22/22 181 lb 8 oz (82.3 kg)  11/09/22 182 lb 8 oz (82.8 kg)  11/04/22 175 lb (79.4 kg)     ASSESSMENT AND PLAN:  Problem List Items Addressed This Visit       Cardiology Problems   AAA (abdominal aortic aneurysm) (HCC)   Relevant Orders   EKG 12-Lead (Completed)   Hyperlipidemia   CAD (coronary artery disease) - Primary   Relevant Orders   EKG 12-Lead (Completed)   Stable angina (HCC)   Relevant Orders   EKG 12-Lead (Completed)     Other   GERD (gastroesophageal reflux disease)   Prediabetes   Other Visit Diagnoses       Chest pain of uncertain etiology        Relevant Orders   EKG 12-Lead (Completed)      Chronic stable angina Chronic stable angina symptoms described as a tightness Prior cath cessation May 2023 detailing occluded RCA with collaterals from left to right, 40% proximal LAD We have recommended if chest tightness gets worse he call our office for further evaluation He could try to increase his isosorbide up to 30 mg daily from 15  Hyperlipidemia Crestor 40 daily,Zetia, LDL is 70 We did discuss Repatha/Praluent or bempedoic acid to achieve goal LDL less than 55.  He prefers lifestyle modification at this time  Essential hypertension Blood pressure is well controlled on today's visit. No changes made to the medications.    Signed, Dossie Arbour, M.D., Ph.D. [facility] [place], [place] [phone]

## 2022-12-22 ENCOUNTER — Encounter: Payer: Self-pay | Admitting: Cardiovascular Disease

## 2022-12-22 ENCOUNTER — Ambulatory Visit: Payer: Medicare PPO | Attending: Cardiovascular Disease | Admitting: Cardiovascular Disease

## 2022-12-22 VITALS — BP 130/70 | HR 52 | Ht 66.0 in | Wt 181.5 lb

## 2022-12-22 DIAGNOSIS — R079 Chest pain, unspecified: Secondary | ICD-10-CM | POA: Diagnosis not present

## 2022-12-22 DIAGNOSIS — E782 Mixed hyperlipidemia: Secondary | ICD-10-CM

## 2022-12-22 DIAGNOSIS — R7303 Prediabetes: Secondary | ICD-10-CM

## 2022-12-22 DIAGNOSIS — I2089 Other forms of angina pectoris: Secondary | ICD-10-CM

## 2022-12-22 DIAGNOSIS — I25118 Atherosclerotic heart disease of native coronary artery with other forms of angina pectoris: Secondary | ICD-10-CM

## 2022-12-22 DIAGNOSIS — I714 Abdominal aortic aneurysm, without rupture, unspecified: Secondary | ICD-10-CM | POA: Diagnosis not present

## 2022-12-22 DIAGNOSIS — K219 Gastro-esophageal reflux disease without esophagitis: Secondary | ICD-10-CM | POA: Diagnosis not present

## 2022-12-22 MED ORDER — METOPROLOL SUCCINATE ER 25 MG PO TB24: 12.5000 mg | ORAL_TABLET | Freq: Every day | ORAL | 3 refills | Status: DC

## 2022-12-22 MED ORDER — EZETIMIBE 10 MG PO TABS: 10.0000 mg | ORAL_TABLET | Freq: Every day | ORAL | 3 refills | Status: DC

## 2022-12-22 MED ORDER — ISOSORBIDE MONONITRATE ER 30 MG PO TB24: 30.0000 mg | ORAL_TABLET | Freq: Every day | ORAL | 3 refills | Status: AC

## 2022-12-22 MED ORDER — ROSUVASTATIN CALCIUM 40 MG PO TABS: 40.0000 mg | ORAL_TABLET | Freq: Every day | ORAL | 3 refills | Status: DC

## 2022-12-22 NOTE — Patient Instructions (Addendum)

## 2023-01-22 ENCOUNTER — Other Ambulatory Visit: Payer: Self-pay | Admitting: Family Medicine

## 2023-01-22 DIAGNOSIS — K219 Gastro-esophageal reflux disease without esophagitis: Secondary | ICD-10-CM

## 2023-01-22 NOTE — Telephone Encounter (Signed)
He is in the process of moving out of state  We are refilling until he gets est with new pcp Thanks

## 2023-01-22 NOTE — Telephone Encounter (Signed)
It doesn't look like pt has ever had a CPE since establishing care in 2022 only acute appts., please advise

## 2023-02-25 ENCOUNTER — Telehealth (INDEPENDENT_AMBULATORY_CARE_PROVIDER_SITE_OTHER): Payer: Self-pay

## 2023-02-25 ENCOUNTER — Other Ambulatory Visit (INDEPENDENT_AMBULATORY_CARE_PROVIDER_SITE_OTHER): Payer: Self-pay | Admitting: Nurse Practitioner

## 2023-02-25 DIAGNOSIS — I714 Abdominal aortic aneurysm, without rupture, unspecified: Secondary | ICD-10-CM

## 2023-02-25 NOTE — Telephone Encounter (Signed)
Patient left a message stating that he has moved to [place] and is requesting referral to sent to [facility] in [place]. Please Advise

## 2023-02-25 NOTE — Telephone Encounter (Signed)
 Referral placed.

## 2023-02-25 NOTE — Telephone Encounter (Signed)
 Patient notified referral has been sent.

## 2023-02-26 NOTE — Telephone Encounter (Signed)
Chelsea [phone]) with [facility] called asking for a referral for this patient to be sent. As of today she has not received anything. Needs referral and Medical Records faxed to them. Fax#[phone].

## 2023-03-01 NOTE — Telephone Encounter (Signed)
 sent

## 2023-03-15 ENCOUNTER — Other Ambulatory Visit: Payer: Self-pay | Admitting: Cardiovascular Disease

## 2023-04-05 ENCOUNTER — Other Ambulatory Visit: Payer: Self-pay | Admitting: Family Medicine

## 2023-04-05 DIAGNOSIS — K219 Gastro-esophageal reflux disease without esophagitis: Secondary | ICD-10-CM

## 2023-05-04 ENCOUNTER — Ambulatory Visit (INDEPENDENT_AMBULATORY_CARE_PROVIDER_SITE_OTHER): Payer: Medicare PPO | Admitting: Vascular Surgery

## 2023-05-04 ENCOUNTER — Other Ambulatory Visit (INDEPENDENT_AMBULATORY_CARE_PROVIDER_SITE_OTHER): Payer: Medicare PPO

## 2023-05-25 ENCOUNTER — Encounter (INDEPENDENT_AMBULATORY_CARE_PROVIDER_SITE_OTHER): Payer: Self-pay

## 2023-10-13 ENCOUNTER — Other Ambulatory Visit: Payer: Self-pay | Admitting: Cardiovascular Disease

## 2023-10-23 ENCOUNTER — Other Ambulatory Visit: Payer: Self-pay | Admitting: Cardiovascular Disease

## 2023-10-23 DIAGNOSIS — E782 Mixed hyperlipidemia: Secondary | ICD-10-CM

## 2023-11-25 DIAGNOSIS — Z0000 Encounter for general adult medical examination without abnormal findings: Secondary | ICD-10-CM | POA: Diagnosis not present

## 2023-11-25 DIAGNOSIS — Z125 Encounter for screening for malignant neoplasm of prostate: Secondary | ICD-10-CM | POA: Diagnosis not present

## 2023-12-21 IMAGING — CR DG CHEST 2V
1 series · 2 of 2 positions shown · non-contrast
Comparison: Comparison is made with imaging from February 17, 2021.

CLINICAL DATA: A 70-year-old male for since for evaluation of chest
pain that started around 10 a.m.

EXAM:
CHEST - 2 VIEW

[Series 1: dg chest 2 view · 0.14mm/px · 2 of 2 slices shown]
[im 1/2]
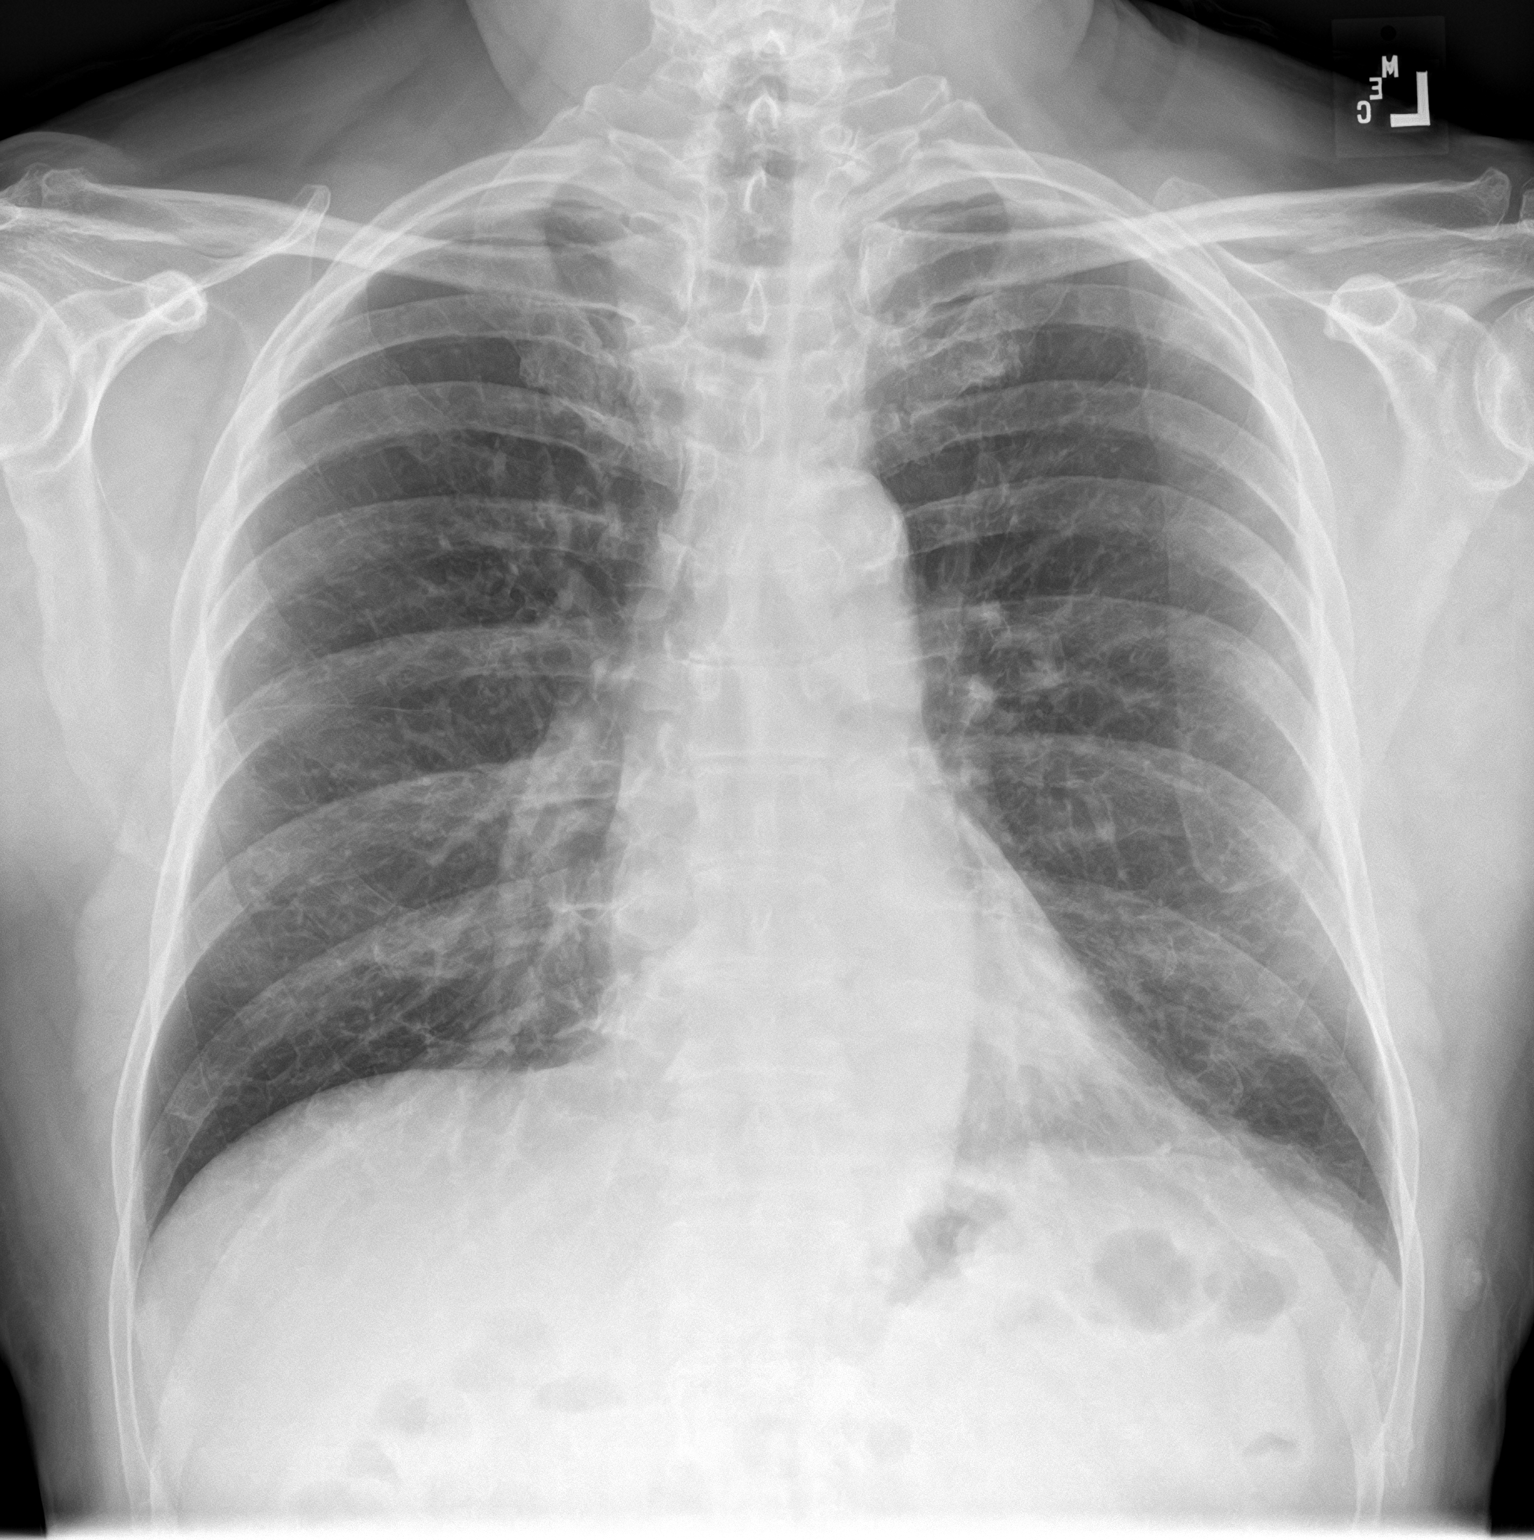
[im 2/2]
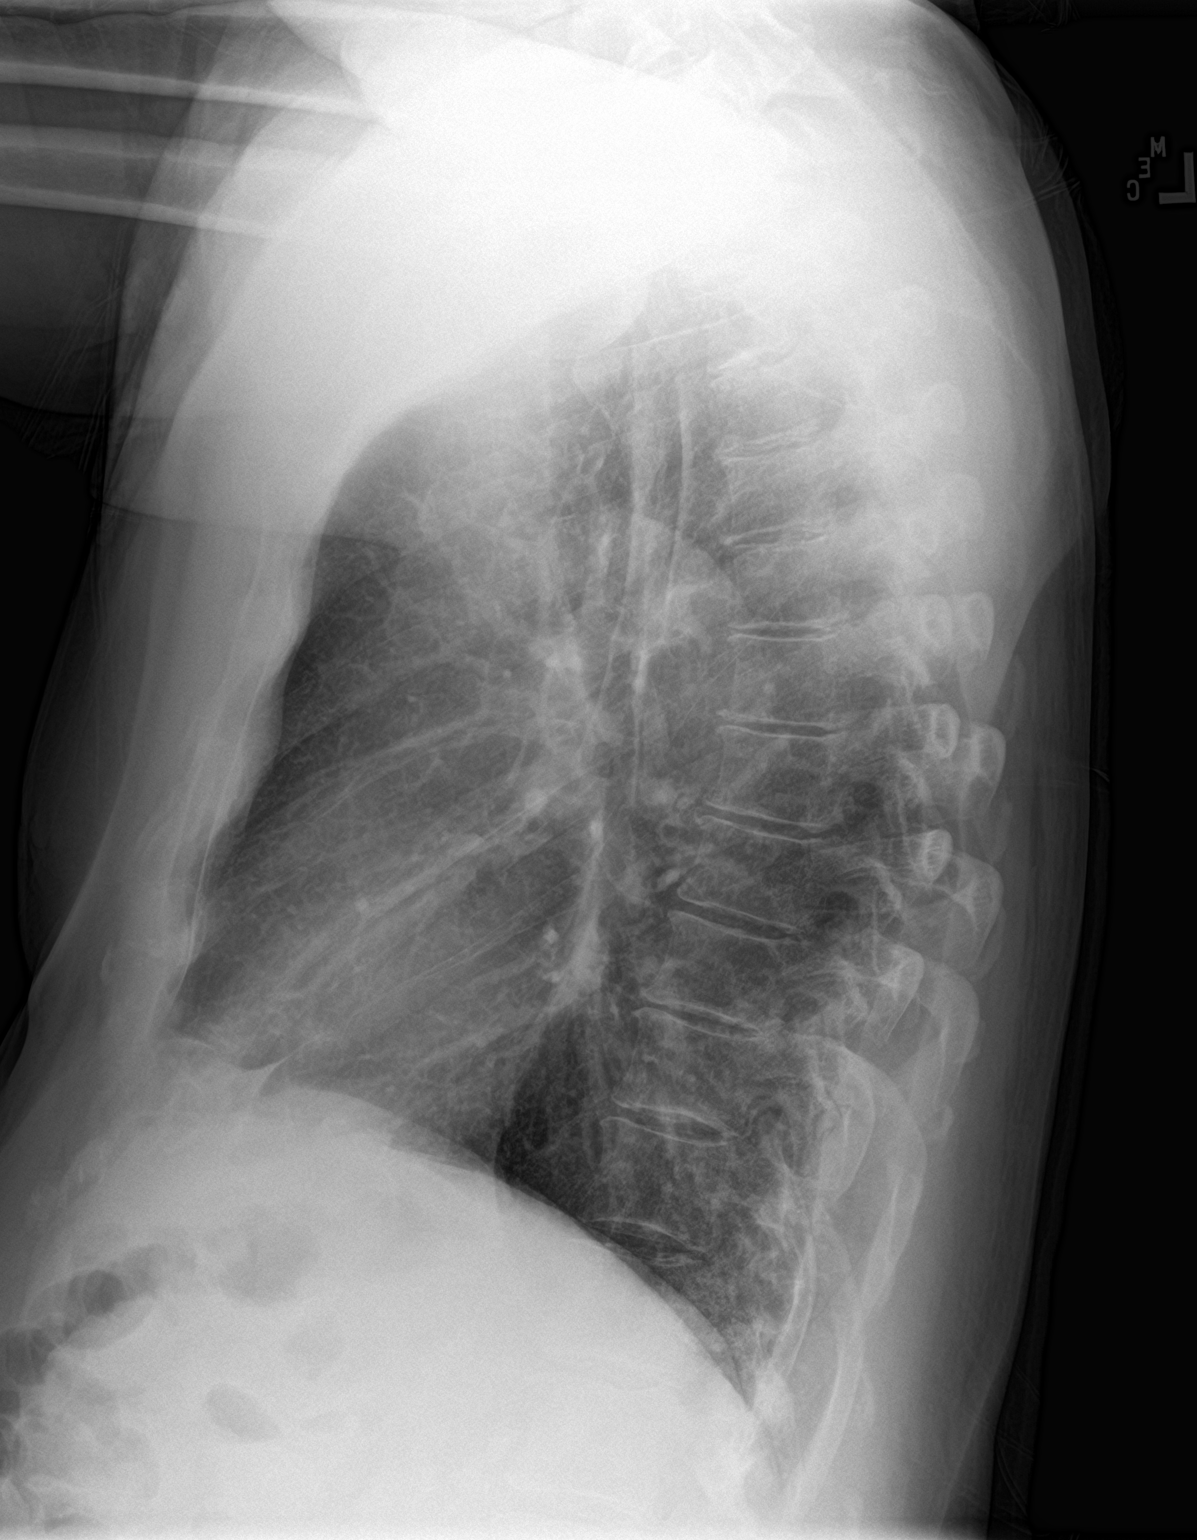

[2 of 2 positions shown; findings below may reference images not displayed]

FINDINGS: Trachea is midline.

Cardiomediastinal contours and hilar structures are normal.

Lungs are clear aside from minimal retrocardiac airspace disease
likely atelectasis.

No signs of pleural effusion.

No visible pneumothorax.

On limited assessment there is no acute skeletal process.
IMPRESSION: Minimal retrocardiac airspace disease likely atelectasis, otherwise
no acute cardiopulmonary disease.

## 2023-12-26 ENCOUNTER — Other Ambulatory Visit: Payer: Self-pay | Admitting: Cardiovascular Disease

## 2023-12-27 NOTE — Telephone Encounter (Signed)
 Patient moved to [place] and has a new cardiologist

## 2023-12-27 NOTE — Telephone Encounter (Signed)
Please contact pt for future appointment. 

## 2023-12-28 IMAGING — CT CT HEART MORP W/ CTA COR W/ SCORE W/ CA W/CM &/OR W/O CM
1 of 16 series · 3 of 20 positions shown, 4 images · non-contrast
Comparison: None.

Addendum:
CLINICAL DATA: Chest pain

EXAM:
Cardiac/Coronary  CTA
TECHNIQUE: The patient was scanned on a Siemens Somatom go.Top scanner.

[Series 4: multiphase % cta coronary 0.60 · axial · 0.37mm/px · z∈[-1211,-1095]mm · 3 of 3223 slices shown, 4 images]
[im 1/3223  vessel]
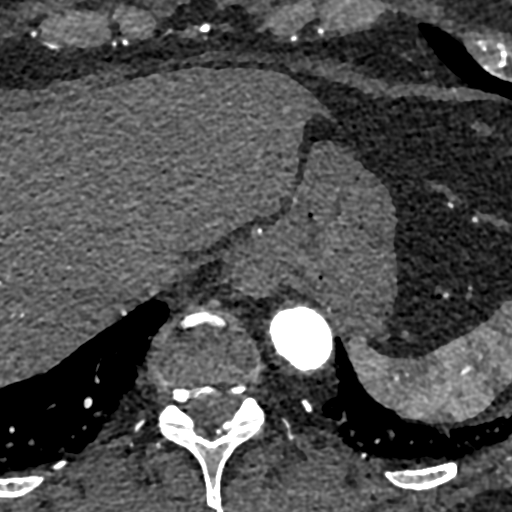
[im 1/3223  lung]
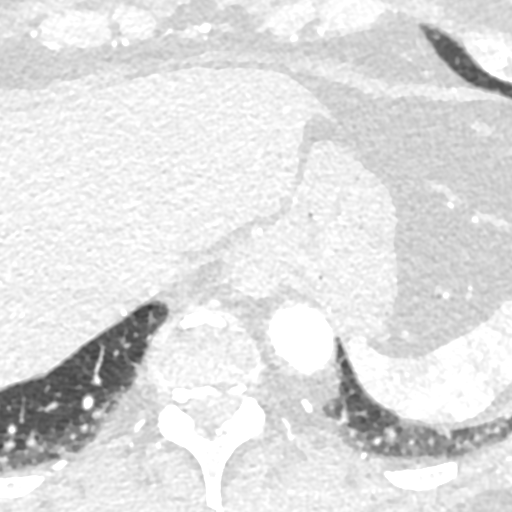
[im 1612/3223  vessel]
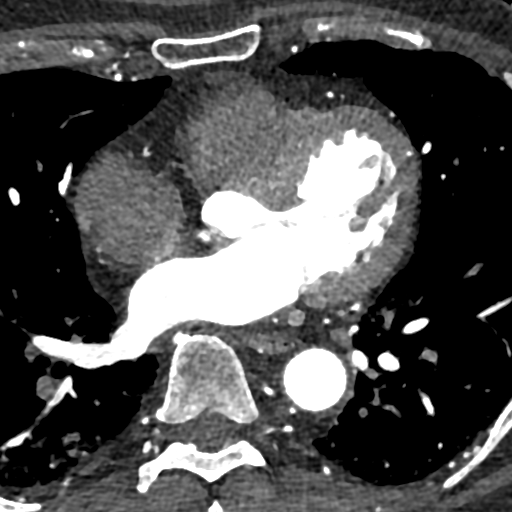
[im 3223/3223  vessel]
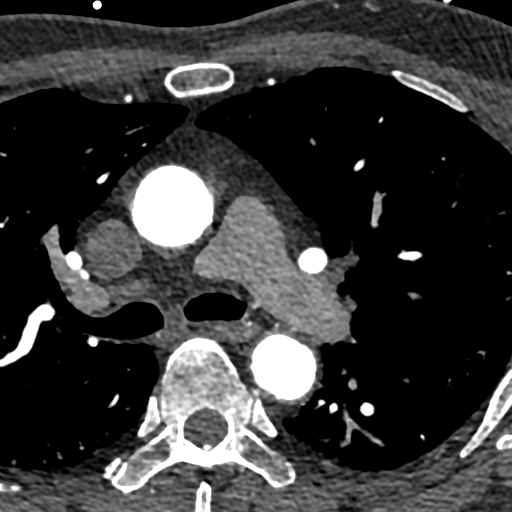

[3 of 20 positions shown; findings below may reference images not displayed]

:
A retrospective scan was triggered in the descending thoracic aorta.
Axial non-contrast 3 mm slices were carried out through the heart.
The data set was analyzed on a dedicated work station and scored
using the Agatson method. Gantry rotation speed was 330 msecs and
collimation was .6 mm. 50mg of metoprolol and 0.8 mg of sl NTG was
given. The 3D data set was reconstructed in 5% intervals of the
60-95 % of the R-R cycle. Diastolic phases were analyzed on a
dedicated work station using MPR, MIP and VRT modes. The patient
received 75 cc of contrast.
FINDINGS: Aorta: Normal size. Aortic root and descending aorta calcifications.
No dissection.

Aortic Valve:  Trileaflet.  No calcifications.

Coronary Arteries:  Normal coronary origin.  Right dominance.

RCA is a dominant artery that gives rise to PDA and PLA. There is
calcified plaque in the proximal to mid RCA causing severe stenosis
(>70%).

Left main gives rise to LAD and LCX arteries.  LM has no disease.

LAD has calcified plaque in the proximal to mid segments causing
moderate stenosis (50-69%).

LCX is a non-dominant artery that gives rise to two obtuse marginal
branches. There is calcified plaque in the proximal LCx causing
minimal stenosis (<25%).

Other findings:

Normal pulmonary vein drainage into the left atrium.

Normal left atrial appendage without a thrombus.

Normal size of the pulmonary artery.
IMPRESSION: 1. Coronary calcium score of 954. This was 85th percentile for age
and sex matched control.

2. Normal coronary origin with right dominance.

3. Calcified plaque in the proximal to mid RCA causing severe
stenosis (>70%).

4. Calcified plaque in the proximal to mid LAD causing moderate
stenosis (50-69%).

5. CAD-RADS 4 Severe stenosis. Cardiac catheterization is
recommended.

6. Image quality degraded by motion and calcium related artifacts.

EXAM:
OVER-READ INTERPRETATION  CT CHEST

The following report is an over-read performed by radiologist Dr.
over-read does not include interpretation of cardiac or coronary
anatomy or pathology. The coronary CTA interpretation by the
cardiologist is attached.
FINDINGS: Vascular: Normal heart size. No pericardial effusion. Moderate
atherosclerotic disease of the thoracic aorta consisting of
calcified and noncalcified plaque, normal caliber thoracic aorta. No
suspicious filling defects of the central pulmonary arteries.

Mediastinum/Nodes: Small hiatal hernia. Mildly enlarged mediastinal
and hilar lymph nodes, likely reactive. Reference right lower
paratracheal lymph node measuring 1.0 cm in short axis on series 20,
image 18.

Lungs/Pleura: Central airways are patent. No consolidation, pleural
effusion or pneumothorax.

Upper Abdomen: No acute abnormality.

Musculoskeletal: No chest wall mass or suspicious bone lesions
identified.
IMPRESSION: No acute extra cardiac abnormality.

*** End of Addendum ***
:
A retrospective scan was triggered in the descending thoracic aorta.
Axial non-contrast 3 mm slices were carried out through the heart.
The data set was analyzed on a dedicated work station and scored
using the Agatson method. Gantry rotation speed was 330 msecs and
collimation was .6 mm. 50mg of metoprolol and 0.8 mg of sl NTG was
given. The 3D data set was reconstructed in 5% intervals of the
60-95 % of the R-R cycle. Diastolic phases were analyzed on a
dedicated work station using MPR, MIP and VRT modes. The patient
received 75 cc of contrast.
FINDINGS: Aorta: Normal size. Aortic root and descending aorta calcifications.
No dissection.

Aortic Valve:  Trileaflet.  No calcifications.

Coronary Arteries:  Normal coronary origin.  Right dominance.

RCA is a dominant artery that gives rise to PDA and PLA. There is
calcified plaque in the proximal to mid RCA causing severe stenosis
(>70%).

Left main gives rise to LAD and LCX arteries.  LM has no disease.

LAD has calcified plaque in the proximal to mid segments causing
moderate stenosis (50-69%).

LCX is a non-dominant artery that gives rise to two obtuse marginal
branches. There is calcified plaque in the proximal LCx causing
minimal stenosis (<25%).

Other findings:

Normal pulmonary vein drainage into the left atrium.

Normal left atrial appendage without a thrombus.

Normal size of the pulmonary artery.
IMPRESSION: 1. Coronary calcium score of 954. This was 85th percentile for age
and sex matched control.

2. Normal coronary origin with right dominance.

3. Calcified plaque in the proximal to mid RCA causing severe
stenosis (>70%).

4. Calcified plaque in the proximal to mid LAD causing moderate
stenosis (50-69%).

5. CAD-RADS 4 Severe stenosis. Cardiac catheterization is
recommended.

6. Image quality degraded by motion and calcium related artifacts.

## 2024-01-07 ENCOUNTER — Other Ambulatory Visit: Payer: Self-pay | Admitting: Cardiovascular Disease

## 2024-01-07 DIAGNOSIS — E782 Mixed hyperlipidemia: Secondary | ICD-10-CM

## 2024-01-19 ENCOUNTER — Other Ambulatory Visit: Payer: Self-pay | Admitting: Family Medicine

## 2024-01-19 DIAGNOSIS — K219 Gastro-esophageal reflux disease without esophagitis: Secondary | ICD-10-CM

## 2024-01-24 ENCOUNTER — Other Ambulatory Visit: Payer: Self-pay

## 2024-01-24 ENCOUNTER — Telehealth: Payer: Self-pay | Admitting: Pharmacy Technician

## 2024-01-24 MED ORDER — EZETIMIBE 10 MG PO TABS: 10.0000 mg | ORAL_TABLET | Freq: Every day | ORAL | 3 refills | Status: AC

## 2024-01-24 NOTE — Telephone Encounter (Signed)
 Hi, the pharmacy is requesting a refill for ezetimibe  to their [facility] please and thank you
